# Patient Record
Sex: Female | Born: 1940 | Race: White | Hispanic: No | Smoking: Current every day smoker
Health system: Southern US, Community
[De-identification: ages and names within clinical notes are randomized; demographics above are authoritative.]

## PROBLEM LIST (undated history)

## (undated) DIAGNOSIS — R519 Headache, unspecified: Secondary | ICD-10-CM

## (undated) DIAGNOSIS — R51 Headache: Secondary | ICD-10-CM

## (undated) DIAGNOSIS — J449 Chronic obstructive pulmonary disease, unspecified: Secondary | ICD-10-CM

## (undated) DIAGNOSIS — I639 Cerebral infarction, unspecified: Secondary | ICD-10-CM

## (undated) DIAGNOSIS — C801 Malignant (primary) neoplasm, unspecified: Secondary | ICD-10-CM

## (undated) DIAGNOSIS — J189 Pneumonia, unspecified organism: Secondary | ICD-10-CM

## (undated) HISTORY — PX: NO PAST SURGERIES: SHX2092

---

## 2010-03-20 ENCOUNTER — Ambulatory Visit: Payer: Self-pay | Admitting: Cardiovascular Disease

## 2010-03-20 ENCOUNTER — Ambulatory Visit: Payer: Self-pay | Admitting: Pulmonary Disease

## 2010-03-20 ENCOUNTER — Inpatient Hospital Stay (HOSPITAL_COMMUNITY): Admission: EM | Admit: 2010-03-20 | Discharge: 2010-03-29 | Payer: Self-pay | Admitting: Emergency Medicine

## 2010-03-21 ENCOUNTER — Encounter: Payer: Self-pay | Admitting: Pulmonary Disease

## 2010-03-24 ENCOUNTER — Encounter (INDEPENDENT_AMBULATORY_CARE_PROVIDER_SITE_OTHER): Payer: Self-pay | Admitting: Internal Medicine

## 2010-04-05 ENCOUNTER — Ambulatory Visit: Payer: Self-pay | Admitting: Internal Medicine

## 2010-04-05 DIAGNOSIS — J4489 Other specified chronic obstructive pulmonary disease: Secondary | ICD-10-CM | POA: Insufficient documentation

## 2010-04-05 DIAGNOSIS — J45909 Unspecified asthma, uncomplicated: Secondary | ICD-10-CM | POA: Insufficient documentation

## 2010-04-05 DIAGNOSIS — J9620 Acute and chronic respiratory failure, unspecified whether with hypoxia or hypercapnia: Secondary | ICD-10-CM | POA: Insufficient documentation

## 2010-04-05 DIAGNOSIS — C4490 Unspecified malignant neoplasm of skin, unspecified: Secondary | ICD-10-CM

## 2010-04-05 DIAGNOSIS — B370 Candidal stomatitis: Secondary | ICD-10-CM | POA: Insufficient documentation

## 2010-04-05 DIAGNOSIS — J479 Bronchiectasis, uncomplicated: Secondary | ICD-10-CM

## 2010-04-05 DIAGNOSIS — J449 Chronic obstructive pulmonary disease, unspecified: Secondary | ICD-10-CM

## 2010-04-05 DIAGNOSIS — R0902 Hypoxemia: Secondary | ICD-10-CM

## 2010-04-08 ENCOUNTER — Ambulatory Visit: Payer: Self-pay | Admitting: Pulmonary Disease

## 2010-04-09 ENCOUNTER — Encounter: Payer: Self-pay | Admitting: Pulmonary Disease

## 2010-04-13 ENCOUNTER — Encounter: Payer: Self-pay | Admitting: Internal Medicine

## 2010-04-14 ENCOUNTER — Ambulatory Visit: Payer: Self-pay | Admitting: Pulmonary Disease

## 2010-04-14 DIAGNOSIS — F17200 Nicotine dependence, unspecified, uncomplicated: Secondary | ICD-10-CM

## 2010-06-28 ENCOUNTER — Ambulatory Visit: Payer: Self-pay | Admitting: Pulmonary Disease

## 2010-07-01 ENCOUNTER — Encounter: Payer: Self-pay | Admitting: Adult Health

## 2010-09-21 NOTE — Miscellaneous (Signed)
Summary: Orders Update pft charges  Clinical Lists Changes  Orders: Added new Service order of Carbon Monoxide diffusing w/capacity (94720) - Signed Added new Service order of Lung Volumes (94240) - Signed Added new Service order of Spirometry (Pre & Post) (94060) - Signed 

## 2010-09-21 NOTE — Assessment & Plan Note (Signed)
Summary: NP follow up - COPD   Primary Provider/Referring Provider:  n/a  CC:  2 month follow up COPD - states breathing is doing well.  no new complaints.  would like o2 dc'd but ONO was never done.  would like flu shot today.[name] [name]  History of Present Illness: 70/F, smoker with COPD/ bronchectasis & severe hypoxia during exacerbation. Admitted 03/20/2010-- 03/29/2010 for  Acute-on-chronic respiratory failure in the setting of acute  exacerbation of chronic obstructive pulmonary disease complicated  by underlying bronchiectasis and mucus plugging. and Thrush.  She was admitted w/ 3 weeks of progressively worsening dsypnea and cough despite OP tx. Found to be hypoxic on arrival to ER.    She was treated empirically with 7 days of oral antibiotics, aggressive systemic steroid taper,    aggressive bronchodilator regimen, and pulmonary hygiene measures. Discharged on slow steroid taper.  Continued on O2. at   oxygen at 5 L and instructed to increase to 6 L with activity.  alpha-1 antitrypsin level was 133  .  CT angio neg for PE, positive for mucus plugging and   bilateral bronchiectasis. Echo - bubble study neg for PFO.  April 14, 2010 3:56 PM   smoked 2 cigs since dc , off nicotine patch.  Doing well with Spriiva/Symbicorrt. She is feeling much better w/ decreased dypsnea.  PFTs >> surprisingly maintained FEV1 82%, small airways severely decreased at 24%, mild increased lung volumes, DLCO decreased at 65% - corrects for Va.   June 28, 2010--Presents for a 2 month follow up COPD .She says she is doing very well. Her breathing is doing well.  She is not wearing her O2 and would like to have it removed . We discussed setting up ONO to make sure she does not have nocturnal desats. Her activity tolerance is improving and no desats with walking today in office. Denies chest pain,  orthopnea, hemoptysis, fever, n/v/d, edema, headache  Medications Prior to Update: 1)  Symbicort 160-4.5 Mcg/act Aero  (Budesonide-Formoterol Fumarate) .... Inhale 1 Puffs Two Times A Day 2)  Pantoprazole Sodium 40 Mg Tbec (Pantoprazole Sodium) .... Take 1 Tablet By Mouth Once A Day 30 Minutes Before Breakfast 3)  Spiriva Handihaler 18 Mcg Caps (Tiotropium Bromide Monohydrate) .... Inhale Contents of 1 Capsule Once A Day 4)  Mucinex 600 Mg Xr12h-Tab (Guaifenesin) .... Take 1-2 Tablets Every 12 Hours As Needed 5)  Proair Hfa 108 (90 Base) Mcg/act Aers (Albuterol Sulfate) .... Inhale 2 Puffs Every Four Hours As Needed 6)  Singulair 10 Mg Tabs (Montelukast Sodium) .... Take 1 Tablet By Mouth Once A Day 7)  Oxygen .... Wear 5l/min At Rest and Increase To 6l/min With Activity 8)  Nicoderm Cq 14 Mg/24hr Pt24 (Nicotine) .... Change Patch Every 24 Hours As Needed 9)  Zyrtec Allergy 10 Mg Tabs (Cetirizine Hcl) .... Take 1 Tablet By Mouth Once A Day 10)  Nystatin 100000 Unit/ml Susp (Nystatin) .... Swish and [place] As Directed 11)  Vitamin C 500 Mg Tabs (Ascorbic Acid) .... Take 2 Tablet By Mouth Once A Day 12)  B Complex  Tabs (B Complex Vitamins) .... Take 1 Tablet By Mouth Once A Day 13)  Vitamin D3 1000 Unit Tabs (Cholecalciferol) .... Take 1 Tablet By Mouth Once A Day  Allergies (verified): 1)  ! Wellbutrin 2)  Codeine  Past History:  Past Medical History: Last updated: 04/05/2010  BRONCHIECTASIS (ICD-494.0) ACUTE AND CHRONIC RESPIRATORY FAILURE /COPD >>alpha-1 antitrypsin level was 133 03/29/10  03/2010  BNP <  30. March 23, 2010, pH of 7.48, pCO2 of 35, pO2 of 55, with a bicarbonate level of 26.  CT angio on July 31, 2011negative pulmonary embolism.  She had mucus plugging within the lower lobe,  evidence of bilateral bronchiectasis. 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  Risk Factors: Smoking Status: quit (04/14/2010) Packs/Day: 0.75 (04/14/2010)  Review of Systems      See HPI  Vital Signs:  Patient profile:   70 year old female Height:      57 inches Weight:      137.38 pounds BMI:     29.84 O2 Sat:      97 % on Room air Temp:     98.0 degrees F oral Pulse rate:   111 / minute BP sitting:   110 / 58  (left arm) Cuff size:   regular  Vitals Entered By: Boone Master CNA/MA (June 28, 2010 3:03 PM)  O2 Flow:  Room air  Serial Vital Signs/Assessments:  Comments: Ambulatory Pulse Oximetry  Resting; HR_98____    02 Sat_97%RA____  Lap1 (185 feet)   HR_109____   02 Sat_96%RA____ Lap2 (185 feet)   HR_112____   02 Sat_96%RA____    Lap3 (185 feet)   HR_113____   02 Sat_96%RA____  _X__Test Completed without Difficulty ___Test Stopped due to:  Renold Genta RCP, LPN  June 28, 2010 3:45 PM  By: Renold Genta RCP, LPN   CC: 2 month follow up COPD - states breathing is doing well.  no new complaints.  would like o2 dc'd but ONO was never done.  would like flu shot today. Is Patient Diabetic? No Comments Medications reviewed with patient Daytime contact number verified with patient. Boone Master CNA/MA  June 28, 2010 3:04 PM    Physical Exam  Additional Exam:  GEN: A/Ox3; pleasant , NAD HEENT:  [place]/AT, , EACs-clear, TMs-wnl, NOSE-clear, THROAT-clear NECK:  Supple w/ fair ROM; no JVD; normal carotid impulses w/o bruits; no thyromegaly or nodules palpated; no lymphadenopathy. RESP  Coarse BS w/ no wheeizng  CARD:  RRR, no m/r/g   GI:   Soft & nt; nml  bowel sounds; no organomegaly or masses detected. Musco: Warm bil,  no calf tenderness edema, clubbing, pulses intact Neuro: intact w/ no focal deficits noted.    Impression & Recommendations:  Problem # 1:  HYPOXEMIA (ICD-799.02) no exertional desaturations will set up ono and if ok, d/c O2.  Orders: Est. Patient Level III (16109)  Problem # 2:  C O P D (ICD-496) cont on same meds well compensated  flu shot today.   Complete Medication List: 1)  Symbicort 160-4.5 Mcg/act Aero (Budesonide-formoterol fumarate) .... Inhale 1 puffs two times a day 2)  Pantoprazole Sodium 40 Mg Tbec (Pantoprazole sodium) .... Take 1 tablet by mouth once a day 30 minutes before breakfast 3)  Spiriva Handihaler 18 Mcg Caps (Tiotropium bromide monohydrate) .... Inhale contents of 1 capsule once a  day 4)  Mucinex 600 Mg Xr12h-tab (Guaifenesin) .... Take 1-2 tablets every 12 hours as needed 5)  Proair Hfa 108 (90 Base) Mcg/act Aers (Albuterol sulfate) .... Inhale 2 puffs every four hours as needed 6)  Oxygen  .... Wear 5l/min at rest and increase to 6l/min with activity 7)  Nicoderm Cq 14 Mg/24hr Pt24 (Nicotine) .... Change patch every 24 hours as needed 8)  Zyrtec Allergy 10 Mg Tabs (Cetirizine hcl) .... Take 1 tablet by mouth once a day 9)  Vitamin C 500 Mg Tabs (Ascorbic acid) .... Take 2 tablet by mouth once a day 10)  B Complex Tabs (B complex vitamins) .... Take 1 tablet by mouth once a day 11)  Vitamin D3 1000 Unit Tabs (Cholecalciferol) .... Take 1 tablet by mouth once a day  Other Orders: DME Referral (DME) Flu Vaccine 53yrs + (95621) Admin 1st Vaccine (30865)  Patient Instructions: 1)  Continue on same meds.  2)  We are setting you up for an overnight Oxygen test.  3)  if this is okay, we will stop your Oxygen 4)  flu shot today.  5)  follow up Dr. Vassie Loll in 4 months and as needed  6)  Please contact office for sooner follow up if symptoms do not improve or worsen    Immunizations  Administered:  Influenza Vaccine # 1:    Vaccine Type: Fluvax 3+    Site: left deltoid    Mfr: novartis     Dose: 0.5 ml    Route: IM    Given by: Boone Master CNA/MA    Exp. Date: 01/21/2011    Lot #: [id number]    VIS given: 03/16/10 version given June 28, 2010.  Flu Vaccine Consent Questions:    Do you have a history of severe allergic reactions to this vaccine? no    Any prior history of allergic reactions to egg and/or gelatin? no    Do you have a sensitivity to the preservative Thimersol? no    Do you have a past history of Guillan-Barre Syndrome? no    Do you currently have an acute febrile illness? no    Have you ever had a severe reaction to latex? no    Vaccine information given and explained to patient? yes    Are you currently pregnant? no

## 2010-09-21 NOTE — Assessment & Plan Note (Signed)
Summary: NP follow up - post hosp   CC:  post hosp follow up - states breathing is doing well.  no new complaints.  History of Present Illness: 70 yo female former smoker( quit 03/20/10)  admitted w/ Acute-on-chronic respiratory failure in the setting of acute     exacerbation of chronic obstructive pulmonary disease complicated  by underlying bronchiectasis and mucus plugging. and Thrush.03/20/10 w/ initial pulmonary consult during hospitalization.   April 05, 2010-Presents for intiial OP ov for post hospital visit. Admitted 03/20/2010-- 03/29/2010 for  Acute-on-chronic respiratory failure in the setting of acute  exacerbation of chronic obstructive pulmonary disease complicated  by underlying bronchiectasis and mucus plugging. and Thrush.  She was admitted w/ 3 weeks of progressively worsening dsypnea and cough despite OP tx. Found to be hypoxic on arrival to ER.    She was treated empirically with 7 days of oral antibiotics, aggressive systemic steroid taper,    aggressive bronchodilator regimen, and pulmonary hygiene measures. Discharged on slow steroid taper.  Continued on O2. at   oxygen at 5 L and instructed to increase to 6 L with activity.  alpha-1 antitrypsin level was 133  .  CT angio neg for PE, positive for mucus plugging and   bilateral bronchiectasis. She has 2 days left of steroids. Doing well with Spriiva/Symbicorrt. She has not smoked since discharge, wearing nicotine patches. She is feeling much better w/ decreased dypsnea. Denies chest pain,  orthopnea, hemoptysis, fever, n/v/d, edema, headache.   Preventive Screening-Counseling & Management  Alcohol-Tobacco     Smoking Status: quit     Smoke Cessation Stage: quit     Year Started: 1962     Year Quit: 03-20-2010  Medications Prior to Update: 1)  None  Current Medications (verified): 1)  Symbicort 160-4.5 Mcg/act Aero (Budesonide-Formoterol Fumarate) .... Inhale 2 Puffs Two Times A Day 2)  Pantoprazole Sodium 40 Mg Tbec  (Pantoprazole Sodium) .... Take 1 Tablet By Mouth Once A Day 30 Minutes Before Breakfast 3)  Spiriva Handihaler 18 Mcg Caps (Tiotropium Bromide Monohydrate) .... Inhale Contents of 1 Capsule Once A Day 4)  Mucinex 600 Mg Xr12h-Tab (Guaifenesin) .... Take 1-2 Tablets Every 12 Hours As Needed 5)  Proair Hfa 108 (90 Base) Mcg/act Aers (Albuterol Sulfate) .... Inhale 2 Puffs Every Four Hours As Needed 6)  Singulair 10 Mg Tabs (Montelukast Sodium) .... Take 1 Tablet By Mouth Once A Day 7)  Oxygen .... Wear 5l/min At Rest and Increase To 6l/min With Activity 8)  Nicoderm Cq 14 Mg/24hr Pt24 (Nicotine) .... Change Patch Every 24 Hours  Allergies (verified): 1)  ! 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 March 23, 2010, pH of 7.48, pCO2 of 35, pO2 of 55, with a bicarbonate level of 26.  CT angio on July 31, 2011negative pulmonary embolism.  She had mucus plugging  within the lower lobe,  evidence of bilateral bronchiectasis. CANDIDIASIS OF MOUTH (ICD-112.0) HYPOXEMIA (ICD-799.02) CARCINOMA, BASAL CELL (ICD-173.9) C O P D (ICD-496) ASTHMA (ICD-493.90)    Family History: allergies - mother asthma - mother rheumatism - MGM w/ osteoarthritis cancer - mother (melanoma) DM - mother type II  controlled with diet, daughter is type I stroke - father alzheimers - father  mother still living at age 53  Social History: patient is divorced lives with daughter and granddaughter, 1 grown son has not smoked since 03-20-10 -  x40years, 1ppd denies illicit drug use denies alcohol manages apartment complexSmoking Status:  quit  Review of Systems      See HPI  Vital Signs:  Patient profile:   70 year old female Height:      57 inches Weight:      132 pounds BMI:     28.67 O2 Sat:      98 % on 5 L/min cont Temp:     99.9 degrees F oral Pulse rate:   61 / minute BP sitting:   132 / 68  (left arm) Cuff size:   regular  Vitals Entered By: Boone Master CNA/MA (April 05, 2010 10:40 AM)  O2 Flow:  5 L/min cont CC: post hosp follow up - states breathing is doing well.  no new complaints Is Patient Diabetic? No Comments Medications reviewed with patient Daytime contact number verified with patient. Boone Master CNA/MA  April 05, 2010 10:46 AM    Ambulatory Pulse Oximetry  Resting; HR__55__    02 Sat__94__  Lap1 (185 feet)   HR__76__   02 Sat__91__ Lap2 (185 feet)   HR__86__   02 Sat___93___    Lap3 (185 feet)   HR__78__   02 Sat___93___  __X__Test Completed without Difficulty ___Test Stopped due to:  Boone Master CNA/MA  April 05, 2010 11:24 AM    Physical Exam  Additional Exam:  GEN: A/Ox3; pleasant , NAD HEENT:  [place]/AT, , EACs-clear, TMs-wnl, NOSE-clear, THROAT-clear NECK:  Supple w/ fair ROM; no JVD; normal carotid impulses w/o bruits; no thyromegaly or nodules palpated; no lymphadenopathy. RESP  Coarse BS w/ no wheeizng  CARD:  RRR, no m/r/g   GI:   Soft & nt; nml bowel sounds; no organomegaly or masses detected. Musco: Warm bil,  no calf tenderness edema, clubbing, pulses intact Neuro: intact w/ no focal deficits noted.    Impression & Recommendations:  Problem # 1:  ACUTE AND CHRONIC RESPIRATORY FAILURE (ICD-518.84) w/ suspected COPD exacerbation  w/ CT chest showing bilateral bronchiectasis  will finish steroid taper.  cont on spiriva and symbicort no desaturations in office today on room air. will change O2 to 2 l/m at bedtime and 2l/m w/ activity  recheck at next ov may be able to d/c if cont w/ no desaturations.  set up for PFTs to eval. lung fxn  Orders: Est. Patient Level III (84132) Pulse Oximetry, Ambulatory (44010)  Medications Added to Medication List This Visit: 1)  Symbicort 160-4.5 Mcg/act Aero (Budesonide-formoterol fumarate) .... Inhale 2 puffs two times a day 2)  Pantoprazole Sodium 40 Mg Tbec (Pantoprazole sodium) .... Take 1 tablet by mouth once a day 30 minutes before breakfast 3)  Spiriva Handihaler 18 Mcg Caps (Tiotropium bromide monohydrate) .... Inhale contents of 1 capsule once a day 4)  Mucinex 600 Mg Xr12h-tab (Guaifenesin) .... Take 1-2 tablets every 12 hours as needed 5)  Proair Hfa 108 (90 Base) Mcg/act Aers (Albuterol sulfate) .... Inhale 2 puffs  every four hours as needed 6)  Singulair 10 Mg Tabs (Montelukast sodium) .... Take 1 tablet by mouth once a day 7)  Oxygen  .... Wear 5l/min at rest and increase to 6l/min with activity 8)  Nicoderm Cq 14 Mg/24hr Pt24 (Nicotine) .... Change patch every 24 hours  Complete Medication List: 1)  Symbicort 160-4.5 Mcg/act Aero (Budesonide-formoterol fumarate) .... Inhale 2 puffs two times a day 2)  Pantoprazole Sodium 40 Mg Tbec (Pantoprazole sodium) .... Take 1 tablet by mouth once a day 30 minutes before breakfast 3)  Spiriva Handihaler 18 Mcg Caps (Tiotropium bromide monohydrate) .... Inhale contents of 1 capsule once a day 4)  Mucinex 600 Mg Xr12h-tab (Guaifenesin) .... Take 1-2 tablets every 12 hours as needed 5)  Proair Hfa 108 (90 Base) Mcg/act Aers (Albuterol sulfate) .... Inhale 2 puffs every four hours as needed 6)  Singulair 10 Mg Tabs (Montelukast sodium) .... Take 1 tablet by mouth once a day 7)  Oxygen  .... Wear 5l/min at rest and increase to 6l/min  with activity 8)  Nicoderm Cq 14 Mg/24hr Pt24 (Nicotine) .... Change patch every 24 hours  Patient Instructions: 1)  Set up for PFT  2)  follow up Dr. Vassie Loll as scheduled.  3)  finish Steroid taper.  4)  Continue on Spiriva and Symbicort- brush/rinse/gargle after use.  5)  Continue on O2 at 2 liters with activity as needed and  6)  wear 2 l/m at bedtime  7)  YOU ARE DOING A GREAT JOB AT NO SMOKING-KEEP UP GOOD WORK.  8)  Please contact office for sooner follow up if symptoms do not improve or worsen    Immunization History:  Influenza Immunization History:    Influenza:  historical (05/22/2009)  Pneumovax Immunization History:    Pneumovax:  historical (05/22/2009)   Appended Document: NP follow up - post hosp would like to correct patient's walking oximetry.  this was done on room air.  Ambulatory Pulse Oximetry  Resting; HR__55__    02 Sat__94ra__  Lap1 (185 feet)   HR__76__   02 Sat__91ra__ Lap2 (185 feet)   HR__86__   02 Sat___93ra___    Lap3 (185 feet)   HR__78__   02 Sat___93ra___  __X__Test Completed without Difficulty ___Test Stopped due to:

## 2010-09-21 NOTE — Assessment & Plan Note (Signed)
Summary: [name] [name]   Visit Type:  Hospital Follow-up Primary Provider/Referring Provider:  n/a  CC:  Pt here for hospital follow up.  History of Present Illness: 70/F, smoker with COPD/ bronchectasis & severe hypoxia during exacerbation. Admitted 03/20/2010-- 03/29/2010 for  Acute-on-chronic respiratory failure in the setting of acute  exacerbation of chronic obstructive pulmonary disease complicated  by underlying bronchiectasis and mucus plugging. and Thrush.  She was admitted w/ 3 weeks of progressively worsening dsypnea and cough despite OP tx. Found to be hypoxic on arrival to ER.    She was treated empirically with 7 days of oral antibiotics, aggressive systemic steroid taper,    aggressive bronchodilator regimen, and pulmonary hygiene measures. Discharged on slow steroid taper.  Continued on O2. at   oxygen at 5 L and instructed to increase to 6 L with activity.  alpha-1 antitrypsin level was 133  .  CT angio neg for PE, positive for mucus plugging and   bilateral bronchiectasis. Echo - bubble study neg for PFO.  April 14, 2010 3:56 PM   smoked 2 cigs since dc , off nicotine patch.  Doing well with Spriiva/Symbicorrt. She is feeling much better w/ decreased dypsnea.  PFTs >> surprisingly maintained FEV1 82%, small airways severely decreased at 24%, mild increased lung volumes, DLCO decreased at 65% - sorrects for Va. Denies chest pain,  orthopnea, hemoptysis, fever, n/v/d, edema, headache.   Preventive Screening-Counseling & Management  Alcohol-Tobacco     Alcohol drinks/day: 0     Smoking Status: quit     Packs/Day: 0.75     Year Started: 1962     Year Quit: 03-20-2010  Current Medications (verified): 1)  Symbicort 160-4.5 Mcg/act Aero (Budesonide-Formoterol Fumarate) .... Inhale 1 Puffs Two Times A Day 2)  Pantoprazole Sodium 40 Mg Tbec (Pantoprazole Sodium) .... Take 1 Tablet By Mouth Once A Day 30 Minutes Before Breakfast 3)  Spiriva Handihaler 18 Mcg Caps (Tiotropium Bromide  Monohydrate) .... Inhale Contents of 1 Capsule Once A Day 4)  Mucinex 600 Mg Xr12h-Tab (Guaifenesin) .... Take 1-2 Tablets Every 12 Hours As Needed 5)  Proair Hfa 108 (90 Base) Mcg/act Aers (Albuterol Sulfate) .... Inhale 2 Puffs Every Four Hours As Needed 6)  Singulair 10 Mg Tabs (Montelukast Sodium) .... Take 1 Tablet By Mouth Once A Day 7)  Oxygen .... Wear 5l/min At Rest and Increase To 6l/min With Activity 8)  Nicoderm Cq 14 Mg/24hr Pt24 (Nicotine) .... Change Patch Every 24 Hours As Needed 9)  Zyrtec Allergy 10 Mg Tabs (Cetirizine Hcl) .... Take 1 Tablet By Mouth Once A Day 10)  Nystatin 100000 Unit/ml Susp (Nystatin) .... Swish and [place] As Directed 11)  Vitamin C 500 Mg Tabs (Ascorbic Acid) .... Take 2 Tablet By Mouth Once A Day 12)  B Complex  Tabs (B Complex Vitamins) .... Take 1 Tablet By Mouth Once A Day 13)  Vitamin D3 1000 Unit Tabs (Cholecalciferol) .... Take 1 Tablet By Mouth Once A Day  Allergies (verified): 1)  ! Wellbutrin 2)  Codeine  Past History:  Past Medical History: Last updated: 04/05/2010  BRONCHIECTASIS (ICD-494.0) ACUTE AND CHRONIC RESPIRATORY FAILURE /COPD >>alpha-1 antitrypsin level was 133 03/29/10  03/2010  BNP < 30. March 23, 2010, pH of 7.48, pCO2 of 35, pO2 of 55, with a bicarbonate level of 26.  CT angio on July 31, 2011negative pulmonary embolism.  She had mucus plugging within the lower lobe,  evidence of bilateral bronchiectasis. CANDIDIASIS OF MOUTH (ICD-112.0) HYPOXEMIA (ICD-799.02) CARCINOMA, BASAL  CELL (ICD-173.9) C O P D (ICD-496) ASTHMA (ICD-493.90)    Social History: Last updated: 04/05/2010 patient is divorced lives with daughter and granddaughter, 1 grown son has not smoked since 03-20-10 -  x40years, 1ppd denies illicit drug use denies alcohol manages apartment complex  Social History: Packs/Day:  0.75 Alcohol drinks/day:  0  Review of Systems       The patient complains of productive cough and weight change.  The patient  denies shortness of breath with activity, shortness of breath at rest, non-productive cough, coughing up blood, chest pain, irregular heartbeats, acid heartburn, indigestion, loss of appetite, abdominal pain, difficulty swallowing, sore throat, tooth/dental problems, headaches, nasal congestion/difficulty breathing through nose, sneezing, itching, ear ache, anxiety, depression, hand/feet swelling, joint stiffness or pain, rash, change in color of mucus, and fever.    Vital Signs:  Patient profile:   70 year old female Height:      57 inches Weight:      139 pounds BMI:     30.19 O2 Sat:      97 % on Room air Temp:     98.5 degrees F oral Pulse rate:   93 / minute BP sitting:   100 / 70  (left arm) Cuff size:   regular  Vitals Entered By: Zackery Barefoot CMA (April 14, 2010 3:43 PM)  O2 Flow:  Room air CC: Pt here for hospital follow up Comments Medications reviewed with patient Verified contact number and pharmacy with patient Zackery Barefoot CMA  April 14, 2010 3:43 PM    Physical Exam  Additional Exam:  GEN: A/Ox3; pleasant , NAD HEENT:  [place]/AT, , EACs-clear, TMs-wnl, NOSE-clear, THROAT-clear NECK:  Supple w/ fair ROM; no JVD; normal carotid impulses w/o bruits; no thyromegaly or nodules palpated; no lymphadenopathy. RESP  Coarse BS w/ no wheeizng  CARD:  RRR, no m/r/g   GI:   Soft & nt; nml bowel sounds; no organomegaly or masses detected. [place]: Warm bil,  no calf tenderness edema, clubbing, pulses intact Neuro: intact w/ no focal deficits noted.    Impression & Recommendations:  Problem # 1:  C O P D (ICD-496) Feel smaller airways more accurately reflect lung fucntion, FEV1 surprisingly preserved stay on symbicort/ spiriva Doubt asthma- can stop singulair  Problem # 2:  BRONCHIECTASIS (ICD-494.0) discussed plan for exacerbation  Problem # 3:  HYPOXEMIA (ICD-799.02)  chk ONO OK to stay off O2 at rest & walking  Orders: Est. Patient Level IV (16109)  Problem  # 4:  TOBACCO ABUSE (ICD-305.1)  Has relapsed - discussed complete cessation. Her updated medication list for this problem includes:    Nicoderm Cq 14 Mg/24hr Pt24 (Nicotine) .Marland Kitchen... Change patch every 24 hours as needed  Orders: Est. Patient Level IV (60454)  Medications Added to Medication List This Visit: 1)  Symbicort 160-4.5 Mcg/act Aero (Budesonide-formoterol fumarate) .... Inhale 1 puffs two times a day 2)  Nicoderm Cq 14 Mg/24hr Pt24 (Nicotine) .... Change patch every 24 hours as needed 3)  Zyrtec Allergy 10 Mg Tabs (Cetirizine hcl) .... Take 1 tablet by mouth once a day 4)  Nystatin 100000 Unit/ml Susp (Nystatin) .... Swish and [place] as directed 5)  Vitamin C 500 Mg Tabs (Ascorbic acid) .... Take 2 tablet by mouth once a day 6)  B Complex Tabs (B complex vitamins) .... Take 1 tablet by mouth once a day 7)  Vitamin D3 1000 Unit Tabs (Cholecalciferol) .... Take 1 tablet by mouth once a day  Other Orders:  DME Referral (DME)  Patient Instructions: 1)  Copy sent to: 2)  Please schedule a follow-up appointment in 2 months with TP 3)  Use nicotine patch 21 mg/day (higher dose) - you are at HIGH RISK for relapse. 4)  Call for chantix Rx if this does not work. 5)  STOP singulair 6)  Check oxygen level at night 7)  OK to return to work fro tomorrow

## 2010-09-21 NOTE — Procedures (Signed)
Summary: Oximetry/[facility]  Oximetry/[facility]   Imported By: Sherian Rein 07/13/2010 12:03:26  _____________________________________________________________________  External Attachment:    Type:   Image     Comment:   External Document

## 2010-09-21 NOTE — Letter (Signed)
Summary: Portable Oxygen/[facility]  Portable Oxygen/[facility]   Imported By: Sherian Rein 04/13/2010 09:51:17  _____________________________________________________________________  External Attachment:    Type:   Image     Comment:   External Document

## 2010-09-21 NOTE — Miscellaneous (Signed)
Summary: Care Plan/[facility]  Care Plan/[facility]   Imported By: Sherian Rein 04/16/2010 11:44:49  _____________________________________________________________________  External Attachment:    Type:   Image     Comment:   External Document

## 2010-09-21 NOTE — Consult Note (Signed)
Summary: [place]  [place]   Imported By: Lester Delight 04/13/2010 08:36:02  _____________________________________________________________________  External Attachment:    Type:   Image     Comment:   External Document

## 2010-11-05 LAB — BLOOD GAS, ARTERIAL
Drawn by: 277331
FIO2: 0.5 %
Patient temperature: 98.6
TCO2: 21.8 mmol/L (ref 0–100)
pH, Arterial: 7.481 — ABNORMAL HIGH (ref 7.350–7.400)
pO2, Arterial: 55.2 mmHg — ABNORMAL LOW (ref 80.0–100.0)

## 2010-11-05 LAB — GLUCOSE, CAPILLARY
Glucose-Capillary: 114 mg/dL — ABNORMAL HIGH (ref 70–99)
Glucose-Capillary: 140 mg/dL — ABNORMAL HIGH (ref 70–99)
Glucose-Capillary: 141 mg/dL — ABNORMAL HIGH (ref 70–99)
Glucose-Capillary: 143 mg/dL — ABNORMAL HIGH (ref 70–99)
Glucose-Capillary: 149 mg/dL — ABNORMAL HIGH (ref 70–99)
Glucose-Capillary: 158 mg/dL — ABNORMAL HIGH (ref 70–99)
Glucose-Capillary: 163 mg/dL — ABNORMAL HIGH (ref 70–99)

## 2010-11-05 LAB — BASIC METABOLIC PANEL
BUN: 20 mg/dL (ref 6–23)
Calcium: 8.8 mg/dL (ref 8.4–10.5)
Calcium: 8.8 mg/dL (ref 8.4–10.5)
Creatinine, Ser: 0.66 mg/dL (ref 0.4–1.2)
Creatinine, Ser: 0.67 mg/dL (ref 0.4–1.2)
Creatinine, Ser: 0.71 mg/dL (ref 0.4–1.2)
GFR calc Af Amer: >60 mL/min (ref >60)
GFR calc non Af Amer: >60 mL/min (ref >60)
GFR calc non Af Amer: >60 mL/min (ref >60)
GFR calc non Af Amer: >60 mL/min (ref >60)
Glucose, Bld: 100 mg/dL — ABNORMAL HIGH (ref 70–99)
Glucose, Bld: 133 mg/dL — ABNORMAL HIGH (ref 70–99)
Potassium: 3 mEq/L — ABNORMAL LOW (ref 3.5–5.1)
Potassium: 4 mEq/L (ref 3.5–5.1)
Sodium: 140 mEq/L (ref 135–145)

## 2010-11-06 LAB — COMPREHENSIVE METABOLIC PANEL
ALT: 18 U/L (ref 0–35)
AST: 18 U/L (ref 0–37)
Calcium: 9.3 mg/dL (ref 8.4–10.5)
GFR calc Af Amer: >60 mL/min (ref >60)
Sodium: 142 mEq/L (ref 135–145)
Total Protein: 6.9 g/dL (ref 6.0–8.3)

## 2010-11-06 LAB — CBC
HCT: 41.5 % (ref 36.0–46.0)
MCHC: 35.1 g/dL (ref 30.0–36.0)
MCV: 92.2 fL (ref 78.0–100.0)
RDW: 13.4 % (ref 11.5–15.5)

## 2010-11-06 LAB — CARDIAC PANEL(CRET KIN+CKTOT+MB+TROPI)
CK, MB: 2.1 ng/mL (ref 0.3–4.0)
CK, MB: 3.1 ng/mL (ref 0.3–4.0)
Relative Index: 2.3 (ref 0.0–2.5)
Relative Index: INVALID (ref 0.0–2.5)
Total CK: 124 U/L (ref 7–177)
Troponin I: 0.02 ng/mL (ref 0.00–0.06)
Troponin I: <0.01 ng/mL (ref 0.00–0.06)

## 2010-11-06 LAB — POCT CARDIAC MARKERS

## 2010-11-06 LAB — LIPID PANEL
LDL Cholesterol: 198 mg/dL — ABNORMAL HIGH (ref 0–99)
Total CHOL/HDL Ratio: 4 RATIO
Triglycerides: 90 mg/dL (ref <150)
VLDL: 18 mg/dL (ref 0–40)

## 2010-11-06 LAB — GLUCOSE, CAPILLARY
Glucose-Capillary: 147 mg/dL — ABNORMAL HIGH (ref 70–99)
Glucose-Capillary: 152 mg/dL — ABNORMAL HIGH (ref 70–99)

## 2010-11-06 LAB — BLOOD GAS, ARTERIAL
Acid-Base Excess: 0.6 mmol/L (ref 0.0–2.0)
Drawn by: 21338
O2 Saturation: 89.1 %
pO2, Arterial: 52.2 mmHg — ABNORMAL LOW (ref 80.0–100.0)

## 2010-11-06 LAB — POCT I-STAT, CHEM 8
BUN: 10 mg/dL (ref 6–23)
Calcium, Ion: 1.1 mmol/L — ABNORMAL LOW (ref 1.12–1.32)
Hemoglobin: 15 g/dL (ref 12.0–15.0)
TCO2: 26 mmol/L (ref 0–100)

## 2014-09-22 DIAGNOSIS — J189 Pneumonia, unspecified organism: Secondary | ICD-10-CM

## 2014-09-22 HISTORY — DX: Pneumonia, unspecified organism: J18.9

## 2014-10-21 DIAGNOSIS — C801 Malignant (primary) neoplasm, unspecified: Secondary | ICD-10-CM

## 2014-10-21 HISTORY — DX: Malignant (primary) neoplasm, unspecified: C80.1

## 2014-11-03 DIAGNOSIS — I639 Cerebral infarction, unspecified: Secondary | ICD-10-CM

## 2014-11-03 HISTORY — DX: Cerebral infarction, unspecified: I63.9

## 2014-11-04 ENCOUNTER — Encounter (HOSPITAL_COMMUNITY): Payer: Self-pay | Admitting: *Deleted

## 2014-11-04 ENCOUNTER — Inpatient Hospital Stay (HOSPITAL_COMMUNITY): Payer: Commercial Managed Care - PPO

## 2014-11-04 ENCOUNTER — Inpatient Hospital Stay (HOSPITAL_COMMUNITY)
Admission: AD | Admit: 2014-11-04 | Discharge: 2014-11-12 | DRG: 054 | Disposition: A | Discharge status: Hospice | Payer: Commercial Managed Care - PPO | Source: Other Acute Inpatient Hospital | Attending: Internal Medicine | Admitting: Internal Medicine

## 2014-11-04 DIAGNOSIS — J159 Unspecified bacterial pneumonia: Secondary | ICD-10-CM | POA: Diagnosis present

## 2014-11-04 DIAGNOSIS — Z515 Encounter for palliative care: Secondary | ICD-10-CM | POA: Diagnosis not present

## 2014-11-04 DIAGNOSIS — C801 Malignant (primary) neoplasm, unspecified: Secondary | ICD-10-CM

## 2014-11-04 DIAGNOSIS — Z791 Long term (current) use of non-steroidal anti-inflammatories (NSAID): Secondary | ICD-10-CM

## 2014-11-04 DIAGNOSIS — C3412 Malignant neoplasm of upper lobe, left bronchus or lung: Secondary | ICD-10-CM | POA: Diagnosis present

## 2014-11-04 DIAGNOSIS — Z720 Tobacco use: Secondary | ICD-10-CM | POA: Diagnosis not present

## 2014-11-04 DIAGNOSIS — C7931 Secondary malignant neoplasm of brain: Principal | ICD-10-CM | POA: Diagnosis present

## 2014-11-04 DIAGNOSIS — Z79899 Other long term (current) drug therapy: Secondary | ICD-10-CM

## 2014-11-04 DIAGNOSIS — E43 Unspecified severe protein-calorie malnutrition: Secondary | ICD-10-CM | POA: Diagnosis present

## 2014-11-04 DIAGNOSIS — Z66 Do not resuscitate: Secondary | ICD-10-CM

## 2014-11-04 DIAGNOSIS — F17200 Nicotine dependence, unspecified, uncomplicated: Secondary | ICD-10-CM | POA: Diagnosis not present

## 2014-11-04 DIAGNOSIS — Z8673 Personal history of transient ischemic attack (TIA), and cerebral infarction without residual deficits: Secondary | ICD-10-CM | POA: Diagnosis not present

## 2014-11-04 DIAGNOSIS — J918 Pleural effusion in other conditions classified elsewhere: Secondary | ICD-10-CM | POA: Diagnosis present

## 2014-11-04 DIAGNOSIS — Z885 Allergy status to narcotic agent status: Secondary | ICD-10-CM

## 2014-11-04 DIAGNOSIS — E876 Hypokalemia: Secondary | ICD-10-CM | POA: Diagnosis present

## 2014-11-04 DIAGNOSIS — C3490 Malignant neoplasm of unspecified part of unspecified bronchus or lung: Secondary | ICD-10-CM | POA: Diagnosis not present

## 2014-11-04 DIAGNOSIS — Z820 Family history of epilepsy and other diseases of the nervous system: Secondary | ICD-10-CM

## 2014-11-04 DIAGNOSIS — J449 Chronic obstructive pulmonary disease, unspecified: Secondary | ICD-10-CM | POA: Diagnosis present

## 2014-11-04 DIAGNOSIS — T380X5A Adverse effect of glucocorticoids and synthetic analogues, initial encounter: Secondary | ICD-10-CM | POA: Diagnosis present

## 2014-11-04 DIAGNOSIS — G4700 Insomnia, unspecified: Secondary | ICD-10-CM | POA: Diagnosis present

## 2014-11-04 DIAGNOSIS — G936 Cerebral edema: Secondary | ICD-10-CM | POA: Diagnosis present

## 2014-11-04 DIAGNOSIS — Z23 Encounter for immunization: Secondary | ICD-10-CM

## 2014-11-04 DIAGNOSIS — Z888 Allergy status to other drugs, medicaments and biological substances status: Secondary | ICD-10-CM

## 2014-11-04 DIAGNOSIS — F40240 Claustrophobia: Secondary | ICD-10-CM | POA: Diagnosis present

## 2014-11-04 DIAGNOSIS — J42 Unspecified chronic bronchitis: Secondary | ICD-10-CM | POA: Diagnosis not present

## 2014-11-04 DIAGNOSIS — F17210 Nicotine dependence, cigarettes, uncomplicated: Secondary | ICD-10-CM | POA: Diagnosis present

## 2014-11-04 DIAGNOSIS — D72829 Elevated white blood cell count, unspecified: Secondary | ICD-10-CM | POA: Diagnosis present

## 2014-11-04 DIAGNOSIS — R64 Cachexia: Secondary | ICD-10-CM | POA: Diagnosis present

## 2014-11-04 DIAGNOSIS — R630 Anorexia: Secondary | ICD-10-CM | POA: Diagnosis not present

## 2014-11-04 DIAGNOSIS — G9340 Encephalopathy, unspecified: Secondary | ICD-10-CM | POA: Diagnosis present

## 2014-11-04 DIAGNOSIS — Z681 Body mass index (BMI) 19 or less, adult: Secondary | ICD-10-CM

## 2014-11-04 DIAGNOSIS — D630 Anemia in neoplastic disease: Secondary | ICD-10-CM | POA: Diagnosis present

## 2014-11-04 DIAGNOSIS — Z823 Family history of stroke: Secondary | ICD-10-CM | POA: Diagnosis not present

## 2014-11-04 DIAGNOSIS — Z800 Family history of malignant neoplasm of digestive organs: Secondary | ICD-10-CM

## 2014-11-04 DIAGNOSIS — Z808 Family history of malignant neoplasm of other organs or systems: Secondary | ICD-10-CM | POA: Diagnosis not present

## 2014-11-04 DIAGNOSIS — R531 Weakness: Secondary | ICD-10-CM

## 2014-11-04 DIAGNOSIS — E039 Hypothyroidism, unspecified: Secondary | ICD-10-CM | POA: Diagnosis present

## 2014-11-04 DIAGNOSIS — R519 Headache, unspecified: Secondary | ICD-10-CM

## 2014-11-04 DIAGNOSIS — R51 Headache: Secondary | ICD-10-CM | POA: Diagnosis present

## 2014-11-04 DIAGNOSIS — J158 Pneumonia due to other specified bacteria: Secondary | ICD-10-CM | POA: Diagnosis present

## 2014-11-04 HISTORY — DX: Cerebral infarction, unspecified: I63.9

## 2014-11-04 HISTORY — DX: Pneumonia, unspecified organism: J18.9

## 2014-11-04 HISTORY — DX: Headache, unspecified: R51.9

## 2014-11-04 HISTORY — DX: Chronic obstructive pulmonary disease, unspecified: J44.9

## 2014-11-04 HISTORY — DX: Malignant (primary) neoplasm, unspecified: C80.1

## 2014-11-04 HISTORY — DX: Headache: R51

## 2014-11-04 LAB — COMPREHENSIVE METABOLIC PANEL
ALK PHOS: 80 U/L (ref 39–117)
ALT: 11 U/L (ref 0–35)
AST: 8 U/L (ref 0–37)
Albumin: 2.7 g/dL — ABNORMAL LOW (ref 3.5–5.2)
Anion gap: 9 (ref 5–15)
BUN: 15 mg/dL (ref 6–23)
CO2: 32 mmol/L (ref 19–32)
Calcium: 8.2 mg/dL — ABNORMAL LOW (ref 8.4–10.5)
Chloride: 94 mmol/L — ABNORMAL LOW (ref 96–112)
Creatinine, Ser: 0.62 mg/dL (ref 0.50–1.10)
GFR calc Af Amer: >90 mL/min (ref >90)
GFR calc non Af Amer: 87 mL/min — ABNORMAL LOW (ref >90)
Glucose, Bld: 207 mg/dL — ABNORMAL HIGH (ref 70–99)
Potassium: 3.6 mmol/L (ref 3.5–5.1)
SODIUM: 135 mmol/L (ref 135–145)
TOTAL PROTEIN: 6.1 g/dL (ref 6.0–8.3)
Total Bilirubin: 0.8 mg/dL (ref 0.3–1.2)

## 2014-11-04 LAB — CBC WITH DIFFERENTIAL/PLATELET
BASOS ABS: 0 10*3/uL (ref 0.0–0.1)
Basophils Relative: 0 % (ref 0–1)
EOS ABS: 0 10*3/uL (ref 0.0–0.7)
Eosinophils Relative: 0 % (ref 0–5)
HCT: 34.4 % — ABNORMAL LOW (ref 36.0–46.0)
Hemoglobin: 10.9 g/dL — ABNORMAL LOW (ref 12.0–15.0)
Lymphocytes Relative: 1 % — ABNORMAL LOW (ref 12–46)
Lymphs Abs: 0.3 10*3/uL — ABNORMAL LOW (ref 0.7–4.0)
MCH: 24.1 pg — AB (ref 26.0–34.0)
MCHC: 31.7 g/dL (ref 30.0–36.0)
MCV: 75.9 fL — ABNORMAL LOW (ref 78.0–100.0)
MONO ABS: 0.3 10*3/uL (ref 0.1–1.0)
Monocytes Relative: 1 % — ABNORMAL LOW (ref 3–12)
Neutro Abs: 26.9 10*3/uL — ABNORMAL HIGH (ref 1.7–7.7)
Neutrophils Relative %: 98 % — ABNORMAL HIGH (ref 43–77)
PLATELETS: 322 10*3/uL (ref 150–400)
RBC: 4.53 MIL/uL (ref 3.87–5.11)
RDW: 16.4 % — AB (ref 11.5–15.5)
WBC: 27.5 10*3/uL — ABNORMAL HIGH (ref 4.0–10.5)

## 2014-11-04 LAB — TSH: TSH: 8.984 u[IU]/mL — AB (ref 0.350–4.500)

## 2014-11-04 LAB — PROTIME-INR
INR: 1.09 (ref 0.00–1.49)
Prothrombin Time: 14.2 seconds (ref 11.6–15.2)

## 2014-11-04 LAB — PHOSPHORUS: PHOSPHORUS: 3 mg/dL (ref 2.3–4.6)

## 2014-11-04 LAB — MAGNESIUM: Magnesium: 2.1 mg/dL (ref 1.5–2.5)

## 2014-11-04 LAB — APTT: aPTT: 29 seconds (ref 24–37)

## 2014-11-04 MED ORDER — ENSURE COMPLETE PO LIQD
237.0000 mL | Freq: Two times a day (BID) | ORAL | Status: DC
  Administered 2014-11-05: 237 mL via ORAL

## 2014-11-04 MED ORDER — ZOLPIDEM TARTRATE 5 MG PO TABS: 5.0000 mg | ORAL_TABLET | Freq: Every evening | ORAL | Status: DC | PRN

## 2014-11-04 MED ORDER — IPRATROPIUM-ALBUTEROL 0.5-2.5 (3) MG/3ML IN SOLN: 3.0000 mL | RESPIRATORY_TRACT | Status: DC | PRN

## 2014-11-04 MED ORDER — ADULT MULTIVITAMIN W/MINERALS CH
1.0000 | ORAL_TABLET | Freq: Every day | ORAL | Status: DC
  Administered 2014-11-04 – 2014-11-09 (×6): 1 via ORAL
  Filled 2014-11-04 (×9): qty 1

## 2014-11-04 MED ORDER — GUAIFENESIN-DM 100-10 MG/5ML PO SYRP: 5.0000 mL | ORAL_SOLUTION | ORAL | Status: DC | PRN

## 2014-11-04 MED ORDER — ONDANSETRON HCL 4 MG/2ML IJ SOLN: 4.0000 mg | Freq: Four times a day (QID) | INTRAMUSCULAR | Status: DC | PRN

## 2014-11-04 MED ORDER — ACETAMINOPHEN 650 MG RE SUPP: 650.0000 mg | Freq: Four times a day (QID) | RECTAL | Status: DC | PRN

## 2014-11-04 MED ORDER — PANTOPRAZOLE SODIUM 40 MG PO TBEC
40.0000 mg | DELAYED_RELEASE_TABLET | Freq: Every day | ORAL | Status: DC
  Administered 2014-11-05 – 2014-11-11 (×6): 40 mg via ORAL
  Filled 2014-11-04 (×8): qty 1

## 2014-11-04 MED ORDER — POTASSIUM CHLORIDE 20 MEQ/15ML (10%) PO SOLN: 40.0000 meq | Freq: Once | ORAL | Status: DC

## 2014-11-04 MED ORDER — PIPERACILLIN-TAZOBACTAM 3.375 G IVPB
3.3750 g | Freq: Three times a day (TID) | INTRAVENOUS | Status: DC
Start: 2014-11-04 — End: 2014-11-07
  Administered 2014-11-04 – 2014-11-07 (×8): 3.375 g via INTRAVENOUS
  Filled 2014-11-04 (×9): qty 50

## 2014-11-04 MED ORDER — ACETAMINOPHEN 325 MG PO TABS
650.0000 mg | ORAL_TABLET | Freq: Four times a day (QID) | ORAL | Status: DC | PRN
  Administered 2014-11-05 – 2014-11-07 (×4): 650 mg via ORAL
  Filled 2014-11-04 (×4): qty 2

## 2014-11-04 MED ORDER — INFLUENZA VAC SPLIT QUAD 0.5 ML IM SUSY
0.5000 mL | PREFILLED_SYRINGE | INTRAMUSCULAR | Status: AC
  Administered 2014-11-05: 0.5 mL via INTRAMUSCULAR
  Filled 2014-11-04 (×3): qty 0.5

## 2014-11-04 MED ORDER — MORPHINE SULFATE 2 MG/ML IJ SOLN
2.0000 mg | INTRAMUSCULAR | Status: DC | PRN
  Administered 2014-11-08 – 2014-11-10 (×4): 2 mg via INTRAVENOUS
  Filled 2014-11-04 (×4): qty 1

## 2014-11-04 MED ORDER — HYDROCODONE-ACETAMINOPHEN 5-325 MG PO TABS
1.0000 | ORAL_TABLET | ORAL | Status: DC | PRN
  Administered 2014-11-04: 2 via ORAL
  Administered 2014-11-06: 1 via ORAL
  Administered 2014-11-07 – 2014-11-08 (×2): 2 via ORAL
  Filled 2014-11-04: qty 2
  Filled 2014-11-04: qty 1
  Filled 2014-11-04: qty 2

## 2014-11-04 MED ORDER — POLYETHYLENE GLYCOL 3350 17 G PO PACK: 17.0000 g | PACK | Freq: Every day | ORAL | Status: DC | PRN

## 2014-11-04 MED ORDER — ONDANSETRON HCL 4 MG PO TABS: 4.0000 mg | ORAL_TABLET | Freq: Four times a day (QID) | ORAL | Status: DC | PRN

## 2014-11-04 MED ORDER — SODIUM CHLORIDE 0.9 % IV SOLN
INTRAVENOUS | Status: AC
  Administered 2014-11-04 – 2014-11-06 (×3): via INTRAVENOUS

## 2014-11-04 MED ORDER — DEXAMETHASONE SODIUM PHOSPHATE 10 MG/ML IJ SOLN
10.0000 mg | Freq: Four times a day (QID) | INTRAMUSCULAR | Status: AC
Start: 2014-11-04 — End: 2014-11-05
  Administered 2014-11-04 – 2014-11-05 (×5): 10 mg via INTRAVENOUS
  Filled 2014-11-04 (×5): qty 1

## 2014-11-04 MED ORDER — LEVOTHYROXINE SODIUM 25 MCG PO TABS
25.0000 ug | ORAL_TABLET | Freq: Every day | ORAL | Status: DC
  Administered 2014-11-05 – 2014-11-12 (×8): 25 ug via ORAL
  Filled 2014-11-04 (×8): qty 1

## 2014-11-04 MED ORDER — ALUM & MAG HYDROXIDE-SIMETH 200-200-20 MG/5ML PO SUSP: 30.0000 mL | Freq: Four times a day (QID) | ORAL | Status: DC | PRN

## 2014-11-04 NOTE — H&P (Signed)
Patient Demographics  [name] [name], is a 74 y.o. female  MRN: [id number]   DOB - [birth year]  Admit Date - 11/04/2014  Outpatient Primary MD for the patient is No primary care provider on file.   Assessment Nalah Macioce is a pleasant 74 year old female with COPD and tobacco dependency just diagnosed of lung CA(lung biopsy done by [facility] on 10/31/14, and results confirming malignancy shared with patient and family today), who comes in as a referral from [facility] regional ED to be followed by Dr. Alvy Bimler here, after she was found on the floor by family members this morning. Apparently, she could not get up on her on and this is likely a result of metastatic brain lesions(patient has disc with radiology imaging from [place] [facility] but these haven't been uploaded to our system as yet therefore I cant comment on the specifics of the reported findings). However, patient's chest x-ray here shows "Stable chest radiograph findings.  Stable volume loss in the left hemithorax. Again noted is a large lesion involving the left upper lobe and suspect left pleural fluid". Labs so far show a white count of 27,500, potassium 3.6, TSH 8.98. Of note is that patient received high-dose dexamethasone at [facility]. There is question whether she may have postobstructive pneumonia. She will be admitted for further management including lung CA with brain metastases management which will be deferred to Dr.Gorsuch, antibiotics for likely postobstructive pneumonia(Zosyn for anerobe coverage-patient treated with antibiotics in the last 2 weeks for suspected community-acquired pneumonia), Synthroid for hypothyroidism, high-dose steroids pending oncology direction regarding possible palliative brain radiation and physical therapy consultation for ambulation. Patient is oriented in person but doesn't seem to be able to make complex medical decisions. Her family including her sister and daughter plan to go  over the current status with patient in the morning to figure out goals of care and power of attorney. Patient is full code at present. Dr. Alvy Bimler will see patient in a.m(spoke with Dr Benay Spice). Plan Lung cancer/Brain metastases  High-dose steroids  Defer rest of management to oncology COPD (chronic obstructive pulmonary disease)/Pneumonia due to anaerobes/Leucocytosis/Tobacco dependency  Follow UA  Bronchodilators/oxygen supplementation as necessary/Zosyn/nicotine patch Hypothyroidism  Check free T4/T3  Synthroid low-dose DVT/GI Prophylaxis  SCDs  PPI Family Communication: Admission, patients condition and plan of care including tests being ordered have been discussed with the patient and family who indicate understanding and agree with the plan and Code Status.  Code Status   Full  Likely DC  TBD  Condition GUARDED    Time spent in minutes : 67  [place] on the floor by family this morning. Generalized weakness.Marland Kitchen    HPI [name] [name]  is a 74 y.o. female which is a lifelong smoker with COPD and comes after her grand daughter found her on the floor around 6:30 AM today. She could not get up on her on. Patient does not recall how and at what time she ended up on the floor. Apparently she normally gets up around 6:30 in the morning. She had been feeling weak and received antibiotics in the last 2 weeks for suspected pneumonia. She was found to have a left lung mass which was biopsied by pulmonary on Friday the last week and apparently suggested malignancy. The results of the biopsy were shared with the patient's family at [facility] ED today at which point the family decided to bring patient to [place] to be followed by  Dr. Alvy Bimler who takes care of patients daughter. Patient gives limited history as she is lethargic therefore most of the history is obtained from family members including her sisters, mother, her daughter and grandchildren. She was  complaining of headaches before. She denies fever.   Review of Systems   As in the HPI above.   Past Medical History  Diagnosis Date  . COPD (chronic obstructive pulmonary disease)   . Stroke 11/03/14    per family  . Pneumonia Feb 2016  . Headache   . Cancer Mar. 2016    primary lung with mets to brain      Past Surgical History  Procedure Laterality Date  . No past surgeries      Social History History  Substance Use Topics  . Smoking status: Current Every Day Smoker -- 1.00 packs/day for 50 years  . Smokeless tobacco: Never Used  . Alcohol Use: No    Family History History reviewed. No pertinent family history.  Prior to Admission medications   Medication Sig Start Date End Date Taking? Authorizing Provider  Cholecalciferol (VITAMIN D PO) Take 1 tablet by mouth daily.   Yes Historical Provider, MD  Cyanocobalamin (VITAMIN B 12 PO) Take 1 tablet by mouth daily.   Yes Historical Provider, MD  ibuprofen (ADVIL,MOTRIN) 200 MG tablet Take 400 mg by mouth every 6 (six) hours as needed for headache.   Yes Historical Provider, MD  zolpidem (AMBIEN) 10 MG tablet Take 10 mg by mouth at bedtime as needed for sleep.   Yes Historical Provider, MD    Allergies  Allergen Reactions  . Bupropion Hcl     REACTION: hives  . Codeine     REACTION: nausea but can tolerate with food    Physical Exam  Vitals  Blood pressure 123/50, pulse 86, temperature 97.4 F (36.3 C), temperature source Oral, resp. rate 19, height 5\' 6"  (1.676 m), weight 55.339 kg (122 lb), SpO2 96 %.   1. General: lying in bed in NAD. Lethargic.  2. Normal affect and insight, Not Suicidal or Homicidal, Awake Alert, Oriented X 3.  3. No F.N deficits, ALL C.Nerves Intact, Strength 5/5 all 4 extremities, Sensation intact all 4 extremities, Plantars down going.  4. Ears and Eyes appear Normal, Conjunctivae clear, PERRLA. Moist Oral Mucosa.  5. Supple Neck, No JVD, No cervical lymphadenopathy appriciated, No  Carotid Bruits.  6. Symmetrical Chest wall movement, Good air movement bilaterally, CTAB.  7. RRR, No Gallops, Rubs or Murmurs, No Parasternal Heave.  8. Positive Bowel Sounds, Abdomen Soft, Non tender, No organomegaly appriciated,No rebound -guarding or rigidity.  9.  No Cyanosis, Normal Skin Turgor, No Skin Rash or Bruise.  10. Good muscle tone,  joints appear normal , no effusions, Normal ROM.  11. No Palpable Lymph Nodes in Neck or Axillae  Data Review CBC  Recent Labs Lab 11/04/14 2000  WBC 27.5*  HGB 10.9*  HCT 34.4*  PLT 322  MCV 75.9*  MCH 24.1*  MCHC 31.7  RDW 16.4*  LYMPHSABS PENDING  MONOABS PENDING  EOSABS PENDING  BASOSABS PENDING   ------------------------------------------------------------------------------------------------------------------  Chemistries   Recent Labs Lab 11/04/14 2000  NA 135  K 3.6  CL 94*  CO2 32  GLUCOSE 207*  BUN 15  CREATININE 0.62  CALCIUM 8.2*  MG 2.1  AST 8  ALT 11  ALKPHOS 80  BILITOT 0.8   ------------------------------------------------------------------------------------------------------------------ estimated creatinine clearance is 54.7 mL/min (by C-G formula based on Cr of 0.62). ------------------------------------------------------------------------------------------------------------------  Recent Labs  11/04/14 2000  TSH 8.984*     Coagulation profile  Recent Labs Lab 11/04/14 2000  INR 1.09   ------------------------------------------------------------------------------------------------------------------- No results for input(s): DDIMER in the last 72 hours. -------------------------------------------------------------------------------------------------------------------  Cardiac Enzymes No results for input(s): CKMB, TROPONINI, MYOGLOBIN in the last 168 hours.  Invalid input(s):  CK ------------------------------------------------------------------------------------------------------------------ Invalid input(s): POCBNP   ---------------------------------------------------------------------------------------------------------------  Urinalysis No results found for: COLORURINE, APPEARANCEUR, LABSPEC, [place], GLUCOSEU, HGBUR, BILIRUBINUR, KETONESUR, PROTEINUR, UROBILINOGEN, NITRITE, LEUKOCYTESUR  ----------------------------------------------------------------------------------------------------------------  Imaging results  Portable Chest 1 View  11/04/2014   CLINICAL DATA:  COPD and lung cancer.  Headache.  EXAM: PORTABLE CHEST - 1 VIEW  COMPARISON:  11/04/2014  FINDINGS: Again noted is a large density in the left upper lung compatible with a mass. Persistent densities at the left lung base suggest pleural fluid and volume loss. Coarse lung markings compatible with chronic changes and emphysema. Heart and mediastinum appear to be stable. Negative for a pneumothorax.  IMPRESSION: Stable chest radiograph findings.  Stable volume loss in the left hemithorax. Again noted is a large lesion involving the left upper lobe and suspect left pleural fluid.   Electronically Signed   By: Markus Daft M.D.   On: 11/04/2014 21:03        Cylas Falzone M.D on 11/04/2014 at 11:08 PM  Between 7am to 7pm - Pager - [phone]  After 7pm go to [email or web address] - password [email or web address]  And look for the night coverage person covering me after hours  [facility] Office  [phone]

## 2014-11-05 ENCOUNTER — Ambulatory Visit
Admit: 2014-11-05 | Discharge: 2014-11-05 | Disposition: A | Discharge status: Home / Self Care | Payer: Commercial Managed Care - PPO | Attending: Radiation Oncology | Admitting: Radiation Oncology

## 2014-11-05 ENCOUNTER — Inpatient Hospital Stay (HOSPITAL_COMMUNITY): Payer: Commercial Managed Care - PPO

## 2014-11-05 DIAGNOSIS — E039 Hypothyroidism, unspecified: Secondary | ICD-10-CM

## 2014-11-05 DIAGNOSIS — J42 Unspecified chronic bronchitis: Secondary | ICD-10-CM

## 2014-11-05 DIAGNOSIS — J158 Pneumonia due to other specified bacteria: Secondary | ICD-10-CM

## 2014-11-05 DIAGNOSIS — Z720 Tobacco use: Secondary | ICD-10-CM

## 2014-11-05 DIAGNOSIS — J188 Other pneumonia, unspecified organism: Secondary | ICD-10-CM

## 2014-11-05 DIAGNOSIS — E43 Unspecified severe protein-calorie malnutrition: Secondary | ICD-10-CM

## 2014-11-05 DIAGNOSIS — D630 Anemia in neoplastic disease: Secondary | ICD-10-CM

## 2014-11-05 LAB — URINALYSIS, ROUTINE W REFLEX MICROSCOPIC
GLUCOSE, UA: NEGATIVE mg/dL
HGB URINE DIPSTICK: NEGATIVE
Ketones, ur: NEGATIVE mg/dL
Leukocytes, UA: NEGATIVE
Nitrite: NEGATIVE
PH: 6 (ref 5.0–8.0)
Protein, ur: 30 mg/dL — AB
SPECIFIC GRAVITY, URINE: 1.028 (ref 1.005–1.030)
Urobilinogen, UA: 1 mg/dL (ref 0.0–1.0)

## 2014-11-05 LAB — URINE MICROSCOPIC-ADD ON

## 2014-11-05 LAB — CBC
HCT: 32.7 % — ABNORMAL LOW (ref 36.0–46.0)
Hemoglobin: 10.4 g/dL — ABNORMAL LOW (ref 12.0–15.0)
MCH: 24.2 pg — ABNORMAL LOW (ref 26.0–34.0)
MCHC: 31.8 g/dL (ref 30.0–36.0)
MCV: 76.2 fL — ABNORMAL LOW (ref 78.0–100.0)
Platelets: 327 10*3/uL (ref 150–400)
RBC: 4.29 MIL/uL (ref 3.87–5.11)
RDW: 16.6 % — ABNORMAL HIGH (ref 11.5–15.5)
WBC: 25.6 10*3/uL — ABNORMAL HIGH (ref 4.0–10.5)

## 2014-11-05 LAB — BASIC METABOLIC PANEL
Anion gap: 9 (ref 5–15)
BUN: 17 mg/dL (ref 6–23)
CHLORIDE: 98 mmol/L (ref 96–112)
CO2: 30 mmol/L (ref 19–32)
CREATININE: 0.51 mg/dL (ref 0.50–1.10)
Calcium: 8.4 mg/dL (ref 8.4–10.5)
GFR calc Af Amer: >90 mL/min (ref >90)
GLUCOSE: 163 mg/dL — AB (ref 70–99)
POTASSIUM: 3.2 mmol/L — AB (ref 3.5–5.1)
Sodium: 137 mmol/L (ref 135–145)

## 2014-11-05 LAB — T4, FREE: FREE T4: 0.85 ng/dL (ref 0.80–1.80)

## 2014-11-05 MED ORDER — LORAZEPAM 0.5 MG PO TABS
0.5000 mg | ORAL_TABLET | Freq: Four times a day (QID) | ORAL | Status: DC | PRN
  Administered 2014-11-05: 0.5 mg via ORAL
  Filled 2014-11-05: qty 1

## 2014-11-05 MED ORDER — BOOST / RESOURCE BREEZE PO LIQD
1.0000 | Freq: Two times a day (BID) | ORAL | Status: DC
  Administered 2014-11-05 – 2014-11-10 (×9): 1 via ORAL

## 2014-11-05 MED ORDER — TEMAZEPAM 7.5 MG PO CAPS
7.5000 mg | ORAL_CAPSULE | Freq: Every day | ORAL | Status: DC
  Administered 2014-11-05 – 2014-11-10 (×5): 7.5 mg via ORAL
  Filled 2014-11-05 (×6): qty 1

## 2014-11-05 NOTE — Progress Notes (Signed)
[facility] PROGRESS NOTE  [name] [name] ZOX:[id number] DOB: 1941-07-11 DOA: 11/04/2014 PCP: No primary care provider on file.  Assessment/Plan:    Pneumonia due to anaerobes - Patient is currently on Zosyn  Active Problems:   Lung cancer - Oncologist consulted    Brain metastases - Continue decadron - Radiologist/oncologist consulted    COPD (chronic obstructive pulmonary disease) - Stable continue bronchodilators as needed     Leucocytosis - Patient is currently on Decadron and there is a report she may have received some prior to admission    Hypothyroidism - Synthroid as patient had elevated TSH upon initial evaluation. - Will need repeat TSH levels in 4-6 weeks as outpatient    Protein-calorie malnutrition, severe - RD on board - Agree with liberalize his diet to regular diet  Code Status: full Family Communication: none at bedside Disposition Plan: Pending further recommendations from specialist involved   Consultants:  Oncology  Rad Onc  Antibiotics:  Zosyn  HPI/Subjective: Pt has no new complaints. No acute issues overnight.  Objective: Filed Vitals:   11/05/14 1345  BP: 123/52  Pulse: 74  Temp: 97.4 F (36.3 C)  Resp: 20    Intake/Output Summary (Last 24 hours) at 11/05/14 1512 Last data filed at 11/05/14 1330  Gross per 24 hour  Intake  877.5 ml  Output    620 ml  Net  257.5 ml   Filed Weights   11/04/14 1700  Weight: 55.339 kg (122 lb)    Exam:   General:  Pt in nad, alert nad awake  Cardiovascular: RRR, no mrg  Respiratory: Clear to auscultation bilaterally, no wheezes, equal chest rise  Abdomen: Soft, nondistended  Musculoskeletal: No cyanosis or clubbing    Data Reviewed: Basic Metabolic Panel:  Recent Labs Lab 11/04/14 2000 11/05/14 0504  NA 135 137  K 3.6 3.2*  CL 94* 98  CO2 32 30  GLUCOSE 207* 163*  BUN 15 17  CREATININE 0.62 0.51  CALCIUM 8.2* 8.4  MG 2.1  --   PHOS 3.0  --    Liver Function  Tests:  Recent Labs Lab 11/04/14 2000  AST 8  ALT 11  ALKPHOS 80  BILITOT 0.8  PROT 6.1  ALBUMIN 2.7*   No results for input(s): LIPASE, AMYLASE in the last 168 hours. No results for input(s): AMMONIA in the last 168 hours. CBC:  Recent Labs Lab 11/04/14 2000 11/05/14 0504  WBC 27.5* 25.6*  NEUTROABS 26.9*  --   HGB 10.9* 10.4*  HCT 34.4* 32.7*  MCV 75.9* 76.2*  PLT 322 327   Cardiac Enzymes: No results for input(s): CKTOTAL, CKMB, CKMBINDEX, TROPONINI in the last 168 hours. BNP (last 3 results) No results for input(s): BNP in the last 8760 hours.  ProBNP (last 3 results) No results for input(s): PROBNP in the last 8760 hours.  CBG: No results for input(s): GLUCAP in the last 168 hours.  No results found for this or any previous visit (from the past 240 hour(s)).   Studies: Portable Chest 1 View  11/04/2014   CLINICAL DATA:  COPD and lung cancer.  Headache.  EXAM: PORTABLE CHEST - 1 VIEW  COMPARISON:  11/04/2014  FINDINGS: Again noted is a large density in the left upper lung compatible with a mass. Persistent densities at the left lung base suggest pleural fluid and volume loss. Coarse lung markings compatible with chronic changes and emphysema. Heart and mediastinum appear to be stable. Negative for a pneumothorax.  IMPRESSION: Stable chest radiograph  findings.  Stable volume loss in the left hemithorax. Again noted is a large lesion involving the left upper lobe and suspect left pleural fluid.   Electronically Signed   By: Markus Daft M.D.   On: 11/04/2014 21:03    Scheduled Meds: . dexamethasone  10 mg Intravenous 4 times per day  . feeding supplement (RESOURCE BREEZE)  1 Container Oral BID BM  . levothyroxine  25 mcg Oral QAC breakfast  . multivitamin with minerals  1 tablet Oral Daily  . pantoprazole  40 mg Oral Q1200  . piperacillin-tazobactam  3.375 g Intravenous 3 times per day  . potassium chloride  40 mEq Oral Once  . temazepam  7.5 mg Oral QHS    Continuous Infusions: . sodium chloride 50 mL/hr at 11/04/14 2239    Time spent: > 35 minutes    Velvet Bathe  [facility] Pager [phone]  If 7PM-7AM, please contact night-coverage at [email or web address], password [email or web address] 11/05/2014, 3:12 PM  LOS: 1 day

## 2014-11-05 NOTE — Consult Note (Signed)
[name] [name]  Telephone:([phone]) [place] NOTE  Sherleen Pangborn                                MR#: [id number]  DOB: Jun 20, 1941                       CSN#: [id number]  Referring MD: Dr. Sheliah [facility]  Primary MD: No PCP  Reason for Consult:  Metastatic Lung Cancer to Brain  I have seen the patient, examined her and edited the notes as follows  MWN:UUVOZ [name] [name] is a 74 y.o. female smoker with a recent diagnosis of  Lung cancer per biopsy at [facility] in [place] on 10/31/14 (no details available at this time), admitted on 3/15 after being found on the floor by family members. She reports loss of consciousness. No urine or stool incontinence. She had been experiencing confusion over the last month, as well as severe parietal and right temporal headaches during that period as well. She reported trouble finding simple words. No vision changes. Upon evaluation at [facility], a CT of the head was performed which is unavailable for review at this time, not yet transferred. Per chart report, this showed metastatic brain lesions. She was placed on IV Decadron with improvement of her headaches and mentals status. A chest x ray showed stable COPD changes, with again noted a left upper lobe large lesion and left pleural effusion No other workup has been performed to date for review.  She denies any worsening shortness of breath, no chest pain or palpitations. She denies any abdominal pain, nausea or vomiting. No diarrhea or constipation. She reports a 20 lb weight loss for about 1 month, with decreased appetite. She denies any difficulty with ambulation.   We were requested to see this patient with recommendations.        PMH:  Past Medical History  Diagnosis Date  . COPD (chronic obstructive pulmonary disease)   . Stroke 11/03/14    per family  . Pneumonia Feb 2016  . Headache   . Cancer Mar. 2016    primary lung with mets to brain     Surgeries:  Past Surgical History  Procedure Laterality Date  . No past surgeries      Allergies:  Allergies  Allergen Reactions  . Bupropion Hcl     REACTION: hives  . Codeine     REACTION: nausea but can tolerate with food    Medications:  Scheduled Meds: . dexamethasone  10 mg Intravenous 4 times per day  . feeding supplement (ENSURE COMPLETE)  237 mL Oral BID BM  . Influenza vac split quadrivalent PF  0.5 mL Intramuscular Tomorrow-1000  . levothyroxine  25 mcg Oral QAC breakfast  . multivitamin with minerals  1 tablet Oral Daily  . pantoprazole  40 mg Oral Q1200  . piperacillin-tazobactam  3.375 g Intravenous 3 times per day  . potassium chloride  40 mEq Oral Once   Continuous Infusions: . sodium chloride 50 mL/hr at 11/04/14 2239   PRN Meds:.acetaminophen **OR** acetaminophen, alum & mag hydroxide-simeth, guaiFENesin-dextromethorphan, HYDROcodone-acetaminophen, ipratropium-albuterol, morphine injection, ondansetron **OR** ondansetron (ZOFRAN) IV, polyethylene glycol, zolpidem   ROS: Constitutional: Denies fevers, chills or abnormal night sweats Eyes: Denies blurriness of vision, double vision or watery eyes Ears, nose, mouth, throat, and face: Denies mucositis or sore throat Respiratory: Denies cough,  worsening dyspnea or wheezes Cardiovascular: Denies palpitation, chest discomfort or lower extremity swelling Gastrointestinal:  Denies nausea, heartburn or change in bowel habits.She denies abdominal pain. She has lost 20 lbs in 1 month with decreased appetite. Skin: Denies abnormal skin rashes Lymphatics: Denies new lymphadenopathy or easy bruising Neurological: Denies numbness, tingling or new weaknesses. Confusion,  Trouble finding words, and loss of consciousness, headaches for 1 month are new symptoms to her. Behavioral/Psych: Mood is stable, no new changes  All other systems were reviewed with the patient and are negative.  Family History:   Mother had  melanoma Father died with stroke, history of Alzheimer's 1 daughter with colon cancer, currently active patient of Dr. Alvy Bimler  Social History: Divorced. Lives with daughter. Smokes 1ppd cigarettes for 45 years, now decreased to 4-5 cigs a day. No alcohol or recreational drug use. Apart[employer].   Physical Exam   ECOG PERFORMANCE STATUS: 2  Filed Vitals:   11/05/14 0606  BP: 111/48  Pulse: 71  Temp: 97.7 F (36.5 C)  Resp: 18   Filed Weights   December 03, 2014 1700  Weight: 122 lb (55.339 kg)    GENERAL:alert, no distress and comfortable SKIN: skin color pale, texture, turgor are dry, no rashes or significant lesions EYES: normal, conjunctiva are pink and non-injected, sclera clear OROPHARYNX:no exudate, no erythema and lips, buccal mucosa, and tongue normal  NECK: supple, thyroid normal size, non-tender, without nodularity LYMPH:  no palpable lymphadenopathy in the cervical, axillary or inguinal LUNGS: essentially clear to auscultation with some atelectatic sounds, with normal breathing effort HEART: regular rate & rhythm and no murmurs and no lower extremity edema ABDOMEN:abdomen soft, non-tender and normal bowel sounds Musculoskeletal:no cyanosis of digits and no clubbing  PSYCH: alert & oriented x 3 with fluent speech NEURO: no focal motor except some trouble with word selection/ no sensory deficits. Gait not tested  CBC  Recent Labs Lab 12/03/14 2000 11/05/14 0504  WBC 27.5* 25.6*  HGB 10.9* 10.4*  HCT 34.4* 32.7*  PLT 322 327  MCV 75.9* 76.2*  MCH 24.1* 24.2*  MCHC 31.7 31.8  RDW 16.4* 16.6*  LYMPHSABS 0.3*  --   MONOABS 0.3  --   EOSABS 0.0  --   BASOSABS 0.0  --     Anemia panel:  No results for input(s): VITAMINB12, FOLATE, FERRITIN, TIBC, IRON, RETICCTPCT in the last 72 hours.  CMP    Recent Labs Lab 12-03-2014 2000 11/05/14 0504  NA 135 137  K 3.6 3.2*  CL 94* 98  CO2 32 30  GLUCOSE 207* 163*  BUN 15 17  CREATININE 0.62 0.51   CALCIUM 8.2* 8.4  MG 2.1  --   AST 8  --   ALT 11  --   ALKPHOS 80  --   BILITOT 0.8  --         Component Value Date/Time   BILITOT 0.8 Dec 03, 2014 2000      Recent Labs Lab 03-Dec-2014 2000  INR 1.09    No results for input(s): DDIMER in the last 72 hours.  Imaging Studies:  Portable Chest 1 View  December 03, 2014   CLINICAL DATA:  COPD and lung cancer.  Headache.  EXAM: PORTABLE CHEST - 1 VIEW  COMPARISON:  12-03-2014  FINDINGS: Again noted is a large density in the left upper lung compatible with a mass. Persistent densities at the left lung base suggest pleural fluid and volume loss. Coarse lung markings compatible with chronic changes and emphysema. Heart and mediastinum  appear to be stable. Negative for a pneumothorax.  IMPRESSION: Stable chest radiograph findings.  Stable volume loss in the left hemithorax. Again noted is a large lesion involving the left upper lobe and suspect left pleural fluid.   Electronically Signed   By: Markus Daft M.D.   On: 11/04/2014 21:03   Assessment/Plan: 74 y.o.   Metastatic Lung Cancer to the brain Patient was admitted to hospital after found to have metastatic brain lesions She carries a recent diagnosis of Lung Cancer, per biopsy at [facility] in [facility] on 10/31/14 She is on Decadron, please continue She may benefit from [place] for seizure prophylaxis Will need to obtain as soon as possible all records, including biopsy report, CT head, and [place] records to help in treatment of this patient. If not received by later today, she will need to repeat imaging, with MRI brain. 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

## 2014-11-05 NOTE — Care Management Note (Addendum)
    Page 1 of 1   11/12/2014     12:41:17 PM CARE MANAGEMENT NOTE 11/12/2014  Patient:  [name] [name],[name] [name]   Account Number:  [id number]  Date Initiated:  11/05/2014  Documentation initiated by:  Dessa Phi  Subjective/Objective Assessment:   74 y/o f admitted w/PNA, COPD. HF:GBMS Ca.     Action/Plan:   From home.   Anticipated DC Date:  11/12/2014   Anticipated DC Plan:  [place]  CM consult      Choice offered to / List presented to:             Status of service:  Completed, signed off Medicare Important Message given?   (If response is "NO", the following Medicare IM given date fields will be blank) Date Medicare IM given:   Medicare IM given by:   Date Additional Medicare IM given:   Additional Medicare IM given by:    Discharge Disposition:  [place]  Per UR Regulation:  Reviewed for med. necessity/level of care/duration of stay  If discussed at [place] of Stay Meetings, dates discussed:    Comments:  11/12/14 Dessa Phi RN BSN NCM [phone] d/c [address].  11/11/14 Dessa Phi RN BSN NCM [phone] Palliative cons-[facility]-await recommendations.  11/05/14 Dessa Phi RN BSN NCM [phone] Recommend PT cons.

## 2014-11-05 NOTE — Progress Notes (Signed)
INITIAL NUTRITION ASSESSMENT  DOCUMENTATION CODES Per approved criteria  -Severe malnutrition in the context of chronic illness  Pt meets criteria for severe MALNUTRITION in the context of chronic illness as evidenced by severe fat depletion, energy intake <75% or >/=1 month and 11% weight loss x 1 month.  INTERVENTION: D/C Ensure Provide Resource Breeze po BID, each supplement provides 250 kcal and 9 grams of protein Provide daily snack Encourage PO intake  NUTRITION DIAGNOSIS: Unintentional weight loss related to lung Ca w/ brain mets as evidenced by 13 lb weight loss x 1 month.   Goal: Pt to meet >/= 90% of their estimated nutrition needs   Monitor:  PO and supplemental intake, weight, labs, I/O's  Reason for Assessment: Pt identified as at nutrition risk on the Malnutrition Screen Tool  Admitting Dx: Metastatic Lung Cancer to Brain  ASSESSMENT: 74 y.o. female smoker with a recent diagnosis of Lung cancer per biopsy at [facility] in [facility] on 10/31/14 (no details available at this time), admitted on 3/15 after being found on the floor by family members. She reports loss of consciousness.  Pt in room with family bedside. Pt reports eating well and good appetite. PO intake: 50% of regular diet. Pt states that she was living on crackers and water over the last month.  Pt reports 20 lb weight loss over the last month. Based on pt's UBW, pt has lost 13 lb over the last month (11% weight loss x 1 month, significant for time frame).  Pt has been ordered Ensure BID. Pt states that she doesn't like "milky drinks", pt is willing to try [employer]. RD to order. RD to order daily snack of yogurt.   Nutrition Focused Physical Exam:  Subcutaneous Fat:  Orbital Region: WNL Upper Arm Region: severe depletion Thoracic and Lumbar Region: NA  Muscle:  Temple Region: moderate depletion Clavicle Bone Region: moderate depletion Clavicle and Acromion Bone Region: moderate  depletion Scapular Bone Region: severe depletion Dorsal Hand: mild depletion Patellar Region: NA Anterior Thigh Region: NA Posterior Calf Region: NA  Edema: no LE edema  Labs reviewed: Low K  Height: Ht Readings from Last 1 Encounters:  11/04/14 5\' 6"  (1.676 m)    Weight: Wt Readings from Last 1 Encounters:  11/04/14 122 lb (55.339 kg)    Ideal Body Weight: 130 lb  % Ideal Body Weight: 94%  Wt Readings from Last 10 Encounters:  11/04/14 122 lb (55.339 kg)  06/28/10 137 lb 6.1 oz (62.316 kg)  04/14/10 139 lb (63.05 kg)  04/05/10 132 lb (59.875 kg)    Usual Body Weight: 135-137 lb  % Usual Body Weight: 90%  BMI:  Body mass index is 19.7 kg/(m^2).  Estimated Nutritional Needs: Kcal: 1650-1850 Protein: 75-85g Fluid: 1.7L/day  Skin: intact  Diet Order: Diet regular  EDUCATION NEEDS: -No education needs identified at this time   Intake/Output Summary (Last 24 hours) at 11/05/14 1120 Last data filed at 11/05/14 0900  Gross per 24 hour  Intake  637.5 ml  Output    320 ml  Net  317.5 ml    Last BM: 3/14  Labs:   Recent Labs Lab 11/04/14 2000 11/05/14 0504  NA 135 137  K 3.6 3.2*  CL 94* 98  CO2 32 30  BUN 15 17  CREATININE 0.62 0.51  CALCIUM 8.2* 8.4  MG 2.1  --   PHOS 3.0  --   GLUCOSE 207* 163*    CBG (last 3)  No results for  input(s): GLUCAP in the last 72 hours.  Scheduled Meds: . dexamethasone  10 mg Intravenous 4 times per day  . feeding supplement (ENSURE COMPLETE)  237 mL Oral BID BM  . Influenza vac split quadrivalent PF  0.5 mL Intramuscular Tomorrow-1000  . levothyroxine  25 mcg Oral QAC breakfast  . multivitamin with minerals  1 tablet Oral Daily  . pantoprazole  40 mg Oral Q1200  . piperacillin-tazobactam  3.375 g Intravenous 3 times per day  . potassium chloride  40 mEq Oral Once    Continuous Infusions: . sodium chloride 50 mL/hr at 11/04/14 2239    Past Medical History  Diagnosis Date  . COPD (chronic  obstructive pulmonary disease)   . Stroke 11/03/14    per family  . Pneumonia Feb 2016  . Headache   . Cancer Mar. 2016    primary lung with mets to brain    Past Surgical History  Procedure Laterality Date  . No past surgeries      Clayton Bibles, MS, RD, LDN Pager: [phone] After Hours Pager: [phone]

## 2014-11-05 NOTE — Consult Note (Signed)
Radiation Oncology         [phone] ________________________________  Name: [name] [name] MRN: [id number]  Date: 11/04/2014  DOB: 07/26/1941   DIAGNOSIS: The primary encounter diagnosis was Insomnia. Diagnoses of COPD (chronic obstructive pulmonary disease) and Metastasis to brain were also pertinent to this visit.   HISTORY OF PRESENT ILLNESS::[name] [name] is a 74 y.o. female who is seen for an initial consultation visit regarding the patient's diagnosis of metastatic lung cancer with brain metastasis.  The patient presented with a recent diagnosis of lung cancer from workup in [facility]. This is per the patient. We are in the process of trying to get additional information regarding this. The patient more recently has been experiencing some confusion and was found on the floor by family members. She was brought to [facility] and a CT scan of the head revealed intracranial findings. A 3 cm lesion was seen in the right posterior parietal/occipital lobe compatible with metastasis. An area of hemorrhage within the right temporal lobe measured up to 4.5 cm. This was felt to most likely represent hemorrhage within a metastasis. Surrounding edema was present and a 1 cm right to left midline shift was present.  The patient has been transferred to [facility]. She has been placed on IV echo drawn with some improvement in headaches and mental status. The patient does not feel however that there has been a major change as of yet. She was seen today briefly as she was about to go to radiology to obtain a brain MRI scan.    PREVIOUS RADIATION THERAPY: No   PAST MEDICAL HISTORY:  has a past medical history of COPD (chronic obstructive pulmonary disease); Stroke (11/03/14); Pneumonia (Feb 2016); Headache; and Cancer (Mar. 2016).     PAST SURGICAL HISTORY: Past Surgical History  Procedure Laterality Date  . No past surgeries       FAMILY HISTORY: family  history is not on file.   SOCIAL HISTORY:  reports that she has been smoking.  She has never used smokeless tobacco. She reports that she does not drink alcohol or use illicit drugs.   ALLERGIES: Bupropion hcl and Codeine   MEDICATIONS:  Current Facility-Administered Medications  Medication Dose Route Frequency Provider Last Rate Last Dose  . 0.9 %  sodium chloride infusion   Intravenous Continuous Simbiso Ranga, MD 50 mL/hr at 11/05/14 1654    . acetaminophen (TYLENOL) tablet 650 mg  650 mg Oral Q6H PRN Simbiso Ranga, MD       Or  . acetaminophen (TYLENOL) suppository 650 mg  650 mg Rectal Q6H PRN Simbiso Ranga, MD      . alum & mag hydroxide-simeth (MAALOX/MYLANTA) 200-200-20 MG/5ML suspension 30 mL  30 mL Oral Q6H PRN Simbiso Ranga, MD      . dexamethasone (DECADRON) injection 10 mg  10 mg Intravenous 4 times per day Simbiso Ranga, MD   10 mg at 11/05/14 1125  . feeding supplement (RESOURCE BREEZE) (RESOURCE BREEZE) liquid 1 Container  1 Container Oral BID BM Clayton Bibles, RD   1 Container at 11/05/14 1400  . guaiFENesin-dextromethorphan (ROBITUSSIN DM) 100-10 MG/5ML syrup 5 mL  5 mL Oral Q4H PRN Simbiso Ranga, MD      . HYDROcodone-acetaminophen (NORCO/VICODIN) 5-325 MG per tablet 1-2 tablet  1-2 tablet Oral Q4H PRN Nat Math, MD   2 tablet at 11/04/14 2033  . ipratropium-albuterol (DUONEB) 0.5-2.5 (3) MG/3ML nebulizer solution 3 mL  3 mL Nebulization Q4H PRN  Nat Math, MD      . levothyroxine (SYNTHROID, LEVOTHROID) tablet 25 mcg  25 mcg Oral QAC breakfast Simbiso Ranga, MD   25 mcg at 11/05/14 0759  . LORazepam (ATIVAN) tablet 0.5 mg  0.5 mg Oral Q6H PRN Heath Lark, MD   0.5 mg at 11/05/14 1741  . morphine 2 MG/ML injection 2 mg  2 mg Intravenous Q3H PRN Simbiso Ranga, MD      . multivitamin with minerals tablet 1 tablet  1 tablet Oral Daily Simbiso Ranga, MD   1 tablet at 11/05/14 1022  . ondansetron (ZOFRAN) tablet 4 mg  4 mg Oral Q6H PRN Simbiso Ranga, MD       Or  .  ondansetron (ZOFRAN) injection 4 mg  4 mg Intravenous Q6H PRN Simbiso Ranga, MD      . pantoprazole (PROTONIX) EC tablet 40 mg  40 mg Oral Q1200 Simbiso Ranga, MD   40 mg at 11/05/14 1125  . piperacillin-tazobactam (ZOSYN) IVPB 3.375 g  3.375 g Intravenous 3 times per day Nat Math, MD   3.375 g at 11/05/14 1325  . polyethylene glycol (MIRALAX / GLYCOLAX) packet 17 g  17 g Oral Daily PRN Simbiso Ranga, MD      . potassium chloride 20 MEQ/15ML (10%) solution 40 mEq  40 mEq Oral Once Simbiso Ranga, MD   40 mEq at 11/04/14 2300  . temazepam (RESTORIL) capsule 7.5 mg  7.5 mg Oral QHS Ni Gorsuch, MD      . zolpidem (AMBIEN) tablet 5 mg  5 mg Oral QHS PRN Simbiso Ranga, MD         REVIEW OF SYSTEMS:  A 15 point review of systems is documented in the electronic medical record. This was obtained by the nursing staff. However, I reviewed this with the patient to discuss relevant findings and make appropriate changes.  Pertinent items are noted in HPI.    PHYSICAL EXAM:  height is 5\' 6"  (6.720 m) and weight is 122 lb (55.339 kg). Her oral temperature is 97.4 F (36.3 C). Her blood pressure is 123/52 and her pulse is 74. Her respiration is 20 and oxygen saturation is 97%.   ECOG = 2  0 - Asymptomatic (Fully active, able to carry on all predisease activities without restriction)  1 - Symptomatic but completely ambulatory (Restricted in physically strenuous activity but ambulatory and able to carry out work of a light or sedentary nature. For example, light housework, office work)  2 - Symptomatic, <50% in bed during the day (Ambulatory and capable of all self care but unable to carry out any work activities. Up and about more than 50% of waking hours)  3 - Symptomatic, >50% in bed, but not bedbound (Capable of only limited self-care, confined to bed or chair 50% or more of waking hours)  4 - Bedbound (Completely disabled. Cannot carry on any self-care. Totally confined to bed or chair)  5 -  Death   Eustace Pen MM, Creech RH, Tormey DC, et al. [phone]). "Toxicity and response criteria of the [facility]". [place] Oncol. 5 (6): 649-55     LABORATORY DATA:  Lab Results  Component Value Date   WBC 25.6* 11/05/2014   HGB 10.4* 11/05/2014   HCT 32.7* 11/05/2014   MCV 76.2* 11/05/2014   PLT 327 11/05/2014   Lab Results  Component Value Date   NA 137 11/05/2014   K 3.2* 11/05/2014   CL 98 11/05/2014   CO2 30  11/05/2014   Lab Results  Component Value Date   ALT 11 11/04/2014   AST 8 11/04/2014   ALKPHOS 80 11/04/2014   BILITOT 0.8 11/04/2014      RADIOGRAPHY: Portable Chest 1 View  11/04/2014   CLINICAL DATA:  COPD and lung cancer.  Headache.  EXAM: PORTABLE CHEST - 1 VIEW  COMPARISON:  11/04/2014  FINDINGS: Again noted is a large density in the left upper lung compatible with a mass. Persistent densities at the left lung base suggest pleural fluid and volume loss. Coarse lung markings compatible with chronic changes and emphysema. Heart and mediastinum appear to be stable. Negative for a pneumothorax.  IMPRESSION: Stable chest radiograph findings.  Stable volume loss in the left hemithorax. Again noted is a large lesion involving the left upper lobe and suspect left pleural fluid.   Electronically Signed   By: Markus Daft M.D.   On: 11/04/2014 21:03       IMPRESSION:  Metastatic lung cancer with brain metastasis. The patient has a 3 cm lesion in addition to a hemorrhagic area measuring up to 4.5 cm which likely represents a hemorrhage within metastasis as seen on CT scan.   PLAN:  -Additional medical records are being obtained. Pathology and additional aspects of her initial workup will be very helpful. -Decadron has been started. -Brain MRI scan this evening -I would recommend consultation with neurosurgery. The patient has 2 fairly large intracranial lesions with significant surrounding edema. -I anticipate radiation treatment will be a  component of her upcoming overall treatment plan. I discussed this briefly with the patient today before she headed down to radiology. Depending on her MRI scan results, radiosurgery versus whole brain radiation treatment can be discussed further.    ________________________________   Jodelle Gross, MD, PhD   **Disclaimer: This note was dictated with voice recognition software. Similar sounding words can inadvertently be transcribed and this note may contain transcription errors which may not have been corrected upon publication of note.**

## 2014-11-06 ENCOUNTER — Inpatient Hospital Stay (HOSPITAL_COMMUNITY): Payer: Commercial Managed Care - PPO

## 2014-11-06 LAB — URINE CULTURE
COLONY COUNT: NO GROWTH
Culture: NO GROWTH

## 2014-11-06 LAB — T3, FREE: T3, Free: 1 pg/mL — ABNORMAL LOW (ref 2.0–4.4)

## 2014-11-06 MED ORDER — LORAZEPAM 1 MG PO TABS
1.0000 mg | ORAL_TABLET | Freq: Four times a day (QID) | ORAL | Status: DC | PRN
Start: 2014-11-06 — End: 2014-11-10
  Administered 2014-11-06 – 2014-11-09 (×2): 1 mg via ORAL
  Filled 2014-11-06 (×2): qty 1

## 2014-11-06 MED ORDER — DEXAMETHASONE SODIUM PHOSPHATE 10 MG/ML IJ SOLN
10.0000 mg | Freq: Four times a day (QID) | INTRAMUSCULAR | Status: DC
  Administered 2014-11-06 – 2014-11-12 (×24): 10 mg via INTRAVENOUS
  Filled 2014-11-06 (×25): qty 1

## 2014-11-06 MED ORDER — GADOBENATE DIMEGLUMINE 529 MG/ML IV SOLN
10.0000 mL | Freq: Once | INTRAVENOUS | Status: AC | PRN
  Administered 2014-11-06: 10 mL via INTRAVENOUS

## 2014-11-06 NOTE — Progress Notes (Addendum)
Clinical Social Work Department BRIEF PSYCHOSOCIAL ASSESSMENT 11/06/2014  Patient:  [name],[name]     Account Number:  [id number]     [place] date:  11/05/2014  Clinical Social Worker:  [name] [name]  Date/Time:  11/06/2014 11:07 AM  Referred by:  Physician  Date Referred:  11/06/2014 Referred for  Advanced Directives   Other Referral:   Interview type:  Patient Other interview type:   and friend, [name] [name] & daughter, [name] [name] at bedside    PSYCHOSOCIAL DATA Living Status:  [place] Admitted from facility:   Level of care:   Primary support name:  [name] [name] (sister) h#: [id number] w#: [phone] Primary support relationship to patient:  SIBLING Degree of support available:   good    CURRENT CONCERNS Current Concerns  Other - See comment  Post-Acute Placement   Other Concerns:   Advance Directives    SOCIAL WORK ASSESSMENT / PLAN CSW received referral from RN, [name] [name] that patient would like to complete Healthcare POA.   Assessment/plan status:  Information/Referral to [employer] Other assessment/ plan:   Information/referral to community resources:   CSW reviewed advance directives with patient & friend, [name] [name] at bedside.    PATIENT'S/FAMILY'S RESPONSE TO PLAN OF CARE: CSW assisted patient/family in completing Advance Directives. The patient designated her sister, [name] [name] as her primary healthcare agent.  Patient wished not to complete the living will at this time, stating that [name] [name] is well aware of her wishes.    Clinical Social Worker notarized documents and made copies for patient/family. Clinical Social Worker placed copy on shadow chart to be scanned into patient's chart.    CSW reviewed PT evaluation recommending HH Supervision/Assistance - 24 hour vs. SNF. CSW spoke with patient re: discharge planning. Patient informed CSW that she lives with her daughter, [name] [name] who was also diagnosed with cancer  & going through chemo treatments with [name] [name]. Patient states that her grandaughter also lives with her. Patient's main concern at this time is getting her affairs in order, trying to reach a lawyer to complete a will. CSW will follow-up re: discharge planning.    Clinical Social Worker encouraged patient/family to contact with any additional questions or concerns.         [name] [name], [name] Clinical Social Worker cell #: [phone]

## 2014-11-06 NOTE — Progress Notes (Signed)
[name] [name]   DOB:09/13/1940   QV#:[id number]   PPI#:[id number]  No care team member to display  I have seen the patient, examined her and edited the notes as follows  Subjective: Patient seen and examined. Afebrile. Denies fevers, chills, night sweats, vision changes, or mucositis. Denies any respiratory complaints. Denies any chest pain or palpitations. Denies lower extremity swelling. Denies nausea, heartburn or change in bowel habits. Appetite is normal. Denies any dysuria. Denies abnormal skin rashes, or neuropathy. Denies any bleeding issues such as epistaxis, hematemesis, hematuria or hematochezia.  She has intermittent right sided headache. Ambulating without difficulty.  Scheduled Meds: . feeding supplement (RESOURCE BREEZE)  1 Container Oral BID BM  . levothyroxine  25 mcg Oral QAC breakfast  . multivitamin with minerals  1 tablet Oral Daily  . pantoprazole  40 mg Oral Q1200  . piperacillin-tazobactam  3.375 g Intravenous 3 times per day  . potassium chloride  40 mEq Oral Once  . temazepam  7.5 mg Oral QHS   Continuous Infusions: . sodium chloride 50 mL/hr at 11/05/14 1654   PRN Meds:acetaminophen **OR** acetaminophen, alum & mag hydroxide-simeth, guaiFENesin-dextromethorphan, HYDROcodone-acetaminophen, ipratropium-albuterol, LORazepam, morphine injection, ondansetron **OR** ondansetron (ZOFRAN) IV, polyethylene glycol, zolpidem   Objective:  Filed Vitals:   11/06/14 0629  BP: 122/54  Pulse: 65  Temp: 97.8 F (36.6 C)  Resp: 20      Intake/Output Summary (Last 24 hours) at 11/06/14 0630 Last data filed at 11/06/14 1601  Gross per 24 hour  Intake 1094.17 ml  Output    700 ml  Net 394.17 ml     GENERAL:alert, no distress and comfortable SKIN: skin color, texture, turgor are normal, no rashes or significant lesions EYES: normal, conjunctiva are pink and non-injected, sclera clear OROPHARYNX:no exudate, no erythema and lips, buccal mucosa, and tongue normal  NECK:  supple, thyroid normal size, non-tender, without nodularity LYMPH:  no palpable lymphadenopathy in the cervical, axillary or inguinal LUNGS: clear to auscultation and percussion with normal breathing effort HEART: regular rate & rhythm and no murmurs and no lower extremity edema ABDOMEN: soft, non-tender and normal bowel sounds Musculoskeletal:no cyanosis of digits and no clubbing  PSYCH: alert & oriented x 3 with fluent speech NEURO: no focal motor/sensory deficits    CBG (last 3)  No results for input(s): GLUCAP in the last 72 hours.   Labs:   Recent Labs Lab 11/04/14 2000 11/05/14 0504  WBC 27.5* 25.6*  HGB 10.9* 10.4*  HCT 34.4* 32.7*  PLT 322 327  MCV 75.9* 76.2*  MCH 24.1* 24.2*  MCHC 31.7 31.8  RDW 16.4* 16.6*  LYMPHSABS 0.3*  --   MONOABS 0.3  --   EOSABS 0.0  --   BASOSABS 0.0  --      Chemistries:    Recent Labs Lab 11/04/14 2000 11/05/14 0504  NA 135 137  K 3.6 3.2*  CL 94* 98  CO2 32 30  GLUCOSE 207* 163*  BUN 15 17  CREATININE 0.62 0.51  CALCIUM 8.2* 8.4  MG 2.1  --   AST 8  --   ALT 11  --   ALKPHOS 80  --   BILITOT 0.8  --     GFR Estimated Creatinine Clearance: 54.7 mL/min (by C-G formula based on Cr of 0.51).  Liver Function Tests:  Recent Labs Lab 11/04/14 2000  AST 8  ALT 11  ALKPHOS 80  BILITOT 0.8  PROT 6.1  ALBUMIN 2.7*       Imaging  Studies:  Portable Chest 1 View  11-27-14   CLINICAL DATA:  COPD and lung cancer.  Headache.  EXAM: PORTABLE CHEST - 1 VIEW  COMPARISON:  Nov 27, 2014  FINDINGS: Again noted is a large density in the left upper lung compatible with a mass. Persistent densities at the left lung base suggest pleural fluid and volume loss. Coarse lung markings compatible with chronic changes and emphysema. Heart and mediastinum appear to be stable. Negative for a pneumothorax.  IMPRESSION: Stable chest radiograph findings.  Stable volume loss in the left hemithorax. Again noted is a large lesion involving the  left upper lobe and suspect left pleural fluid.   Electronically Signed   By: Markus Daft M.D.   On: 2014/11/27 21:03    Assessment/Plan: 74 y.o.  Metastatic Lung Cancer to the brain Patient was admitted to hospital after found to have metastatic brain lesions She carries a recent diagnosis of Lung Cancer, per biopsy at [facility] on 10/31/14 She is on Decadron, please continue She may benefit from [place] for seizure prophylaxis Awaiting records from  [facility] She may benefit from PET/CT outpatient to further evaluate for metastatic disease Radiation Oncology ordered MRI brain on 3/16 but patient developed claustrophobia and had to be aborted. She wishes to repeat test today, ativan prn is prescribed  Neurosurgery consultation was recommended by Dr. 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 Similar sounding words can inadvertently be transcribed and this note may contain transcription errors which may not have been corrected upon publication of note.Sharene Butters E, PA-C 11/06/2014  8:07 AM Trask Vosler, MD 11/06/2014

## 2014-11-06 NOTE — Progress Notes (Signed)
[facility] PROGRESS NOTE  [name] [name] RXV:[id number] DOB: 08-19-1941 DOA: 11/04/2014 PCP: No primary care provider on file.  Assessment/Plan:    Pneumonia due to anaerobes - Patient is currently on Zosyn  Active Problems:   Lung cancer - Oncologist consulted    Brain metastases - Continue decadron - Radiologist/oncologist consulted - reordered MRI of brain. Will plan on administering higher dose of anxiolytic agent. - discussed with pt the possibility to start antiepileptic medication. Oncology assisting with this decision.    COPD (chronic obstructive pulmonary disease) - Stable continue bronchodilators as needed     Leucocytosis - Patient is currently on Decadron and there is a report she may have received some prior to admission    Hypothyroidism - Synthroid as patient had elevated TSH upon initial evaluation. - Will need repeat TSH levels in 4-6 weeks as outpatient    Protein-calorie malnutrition, severe - RD on board - Agree with liberalize his diet to regular diet  Code Status: full Family Communication: none at bedside Disposition Plan: Pending further recommendations from specialist involved   Consultants:  Oncology  Rad Onc  Antibiotics:  Zosyn  HPI/Subjective: Pt has no new complaints. No acute issues overnight.  Objective: Filed Vitals:   11/06/14 1343  BP: 100/80  Pulse: 86  Temp: 98.1 F (36.7 C)  Resp: 18    Intake/Output Summary (Last 24 hours) at 11/06/14 1346 Last data filed at 11/06/14 5093  Gross per 24 hour  Intake 734.17 ml  Output    400 ml  Net 334.17 ml   Filed Weights   11/04/14 1700  Weight: 55.339 kg (122 lb)    Exam:   General:  Pt in nad, alert nad awake  Cardiovascular: RRR, no mrg  Respiratory: Clear to auscultation bilaterally, no wheezes, equal chest rise  Abdomen: Soft, nondistended  Musculoskeletal: No cyanosis or clubbing    Data Reviewed: Basic Metabolic Panel:  Recent Labs Lab  11/04/14 2000 11/05/14 0504  NA 135 137  K 3.6 3.2*  CL 94* 98  CO2 32 30  GLUCOSE 207* 163*  BUN 15 17  CREATININE 0.62 0.51  CALCIUM 8.2* 8.4  MG 2.1  --   PHOS 3.0  --    Liver Function Tests:  Recent Labs Lab 11/04/14 2000  AST 8  ALT 11  ALKPHOS 80  BILITOT 0.8  PROT 6.1  ALBUMIN 2.7*   No results for input(s): LIPASE, AMYLASE in the last 168 hours. No results for input(s): AMMONIA in the last 168 hours. CBC:  Recent Labs Lab 11/04/14 2000 11/05/14 0504  WBC 27.5* 25.6*  NEUTROABS 26.9*  --   HGB 10.9* 10.4*  HCT 34.4* 32.7*  MCV 75.9* 76.2*  PLT 322 327   Cardiac Enzymes: No results for input(s): CKTOTAL, CKMB, CKMBINDEX, TROPONINI in the last 168 hours. BNP (last 3 results) No results for input(s): BNP in the last 8760 hours.  ProBNP (last 3 results) No results for input(s): PROBNP in the last 8760 hours.  CBG: No results for input(s): GLUCAP in the last 168 hours.  Recent Results (from the past 240 hour(s))  Culture, Urine     Status: None   Collection Time: 11/05/14  5:25 AM  Result Value Ref Range Status   Specimen Description URINE, CLEAN CATCH  Final   Special Requests NONE  Final   Colony Count NO GROWTH Performed at [facility]   Final   Culture NO GROWTH Performed at [facility]   Final  Report Status 11/06/2014 FINAL  Final     Studies: Portable Chest 1 View  11/04/2014   CLINICAL DATA:  COPD and lung cancer.  Headache.  EXAM: PORTABLE CHEST - 1 VIEW  COMPARISON:  11/04/2014  FINDINGS: Again noted is a large density in the left upper lung compatible with a mass. Persistent densities at the left lung base suggest pleural fluid and volume loss. Coarse lung markings compatible with chronic changes and emphysema. Heart and mediastinum appear to be stable. Negative for a pneumothorax.  IMPRESSION: Stable chest radiograph findings.  Stable volume loss in the left hemithorax. Again noted is a large lesion involving the  left upper lobe and suspect left pleural fluid.   Electronically Signed   By: Markus Daft M.D.   On: 11/04/2014 21:03    Scheduled Meds: . feeding supplement (RESOURCE BREEZE)  1 Container Oral BID BM  . levothyroxine  25 mcg Oral QAC breakfast  . multivitamin with minerals  1 tablet Oral Daily  . pantoprazole  40 mg Oral Q1200  . piperacillin-tazobactam  3.375 g Intravenous 3 times per day  . potassium chloride  40 mEq Oral Once  . temazepam  7.5 mg Oral QHS   Continuous Infusions: . sodium chloride 50 mL/hr at 11/06/14 1345    Time spent: > 35 minutes    Velvet Bathe  [facility] Pager [phone]  If 7PM-7AM, please contact night-coverage at [email or web address], password [facility] 11/06/2014, 1:46 PM  LOS: 2 days

## 2014-11-06 NOTE — Evaluation (Signed)
Physical Therapy Evaluation Patient Details Name: Nelva Hauk MRN: [id number] DOB: 1941/03/10 Today's Date: 11/06/2014   History of Present Illness  74 yo female admitted with fall at home, lung cancer with brain mets. Hx of  COPD, lung cancer diagnosed 10/2014, CVA.   Clinical Impression  On eval, pt required Min assist for mobility-able to ambulate ~75 feet with RW. L side appears slightly weaker than R during MMT. Pt slow to process/follow commands. Noted slightly lean to L side and pt tended to veer into objects in environment on L side as well. Pt states she lives with daughter/granddaughter but that daughter is currently undergoing cancer treatment. Pt could potentially d/c home if she will have adequate 24 hour care and family can provide at least Min assist for safe mobility. If this is not an option, then will need to consider ST rehab at [facility].     Follow Up Recommendations Home health PT;Supervision/Assistance - 24 hour (will absolutely need 24 hour care. If this is not an option need to consider ST rehab at [facility])    Equipment Recommendations  None recommended by PT (pt states she has access to DME)    Recommendations for Other Services OT consult     Precautions / Restrictions Precautions Precautions: Fall Restrictions Weight Bearing Restrictions: No      Mobility  Bed Mobility Overal bed mobility: Needs Assistance Bed Mobility: Supine to Sit     Supine to sit: Min guard     General bed mobility comments: close guard for safety  Transfers Overall transfer level: Needs assistance Equipment used: Rolling walker (2 wheeled) Transfers: Sit to/from Stand Sit to Stand: Min assist         General transfer comment: Assist to rise, stabilize, control descent. Multimodal cues for safety, technique, hand placement  Ambulation/Gait Ambulation/Gait assistance: Min assist Ambulation Distance (Feet): 75 Feet (75'x1, 60'x1) Assistive device: Rolling walker (2 wheeled) Gait  Pattern/deviations: Step-through pattern;Trunk flexed     General Gait Details: Assist to stabilize pt and maneuver safely with walker. Fatigues fairly easily. Dyspnea 2-3/4 requiring seated rest break.   Stairs            Wheelchair Mobility    Modified Rankin (Stroke Patients Only)       Balance Overall balance assessment: Needs assistance         Standing balance support: Bilateral upper extremity supported;During functional activity Standing balance-Leahy Scale: Poor Standing balance comment: slight lean/favoring to L side.                              Pertinent Vitals/Pain Pain Assessment: 0-10 Pain Score: 6  Pain Location: headache    Home Living Family/patient expects to be discharged to:: Private residence Living Arrangements: Children;Other relatives (granddaughter) Available Help at Discharge: Family Type of Home: Apartment (townhome) Home Access: Level entry     Home Layout: Two level;Bed/bath upstairs Home Equipment: Walker - 2 wheels;Cane - single point      Prior Function Level of Independence: Independent               Hand Dominance        Extremity/Trunk Assessment   Upper Extremity Assessment: Generalized weakness           Lower Extremity Assessment: RLE deficits/detail;LLE deficits/detail RLE Deficits / Details: At least 4/5 throughout LLE Deficits / Details: appears slightly weaker than R side. At least 3+/5 throughout  Cervical / Trunk  Assessment: Kyphotic  Communication   Communication: No difficulties  Cognition Arousal/Alertness: Awake/alert Behavior During Therapy: Flat affect Overall Cognitive Status: No family/caregiver present to determine baseline cognitive functioning Area of Impairment: Orientation;Memory;Following commands;Safety/judgement;Problem solving Orientation Level: Situation;Place   Memory: Decreased short-term memory Following Commands: Follows one step commands with increased  time Safety/Judgement: Decreased awareness of deficits   Problem Solving: Requires tactile cues;Requires verbal cues;Slow processing      General Comments      Exercises        Assessment/Plan    PT Assessment Patient needs continued PT services  PT Diagnosis Difficulty walking;Generalized weakness;Acute pain;Altered mental status   PT Problem List Decreased strength;Decreased activity tolerance;Decreased balance;Decreased mobility;Decreased knowledge of use of DME;Decreased cognition;Pain;Decreased safety awareness  PT Treatment Interventions DME instruction;Gait training;Functional mobility training;Therapeutic activities;Therapeutic exercise;Patient/family education;Balance training   PT Goals (Current goals can be found in the Care Plan section) Acute Rehab PT Goals Patient Stated Goal: none stated PT Goal Formulation: With patient Time For Goal Achievement: 11/20/14 Potential to Achieve Goals: Good    Frequency Min 3X/week   Barriers to discharge   daughter    Co-evaluation               End of Session Equipment Utilized During Treatment: Gait belt Activity Tolerance: Patient limited by fatigue Patient left: in bed;with call bell/phone within reach;with family/visitor present (sitting EOB)           Time: 0623-7628 PT Time Calculation (min) (ACUTE ONLY): 33 min   Charges:   PT Evaluation $Initial PT Evaluation Tier I: 1 Procedure PT Treatments $Gait Training: 8-22 mins   PT G Codes:        Weston Anna, MPT Pager: [phone]

## 2014-11-07 ENCOUNTER — Inpatient Hospital Stay: Payer: Commercial Managed Care - PPO

## 2014-11-07 ENCOUNTER — Encounter (HOSPITAL_COMMUNITY): Payer: Self-pay | Admitting: Physical Medicine and Rehabilitation

## 2014-11-07 MED ORDER — SODIUM CHLORIDE 0.9 % IJ SOLN
3.0000 mL | Freq: Two times a day (BID) | INTRAMUSCULAR | Status: DC
  Administered 2014-11-07 – 2014-11-12 (×11): 3 mL via INTRAVENOUS

## 2014-11-07 MED ORDER — POTASSIUM CHLORIDE CRYS ER 20 MEQ PO TBCR
40.0000 meq | EXTENDED_RELEASE_TABLET | Freq: Once | ORAL | Status: AC
  Administered 2014-11-07: 40 meq via ORAL
  Filled 2014-11-07: qty 2

## 2014-11-07 MED ORDER — AMOXICILLIN-POT CLAVULANATE 875-125 MG PO TABS
1.0000 | ORAL_TABLET | Freq: Two times a day (BID) | ORAL | Status: DC
  Administered 2014-11-07 – 2014-11-11 (×7): 1 via ORAL
  Filled 2014-11-07 (×11): qty 1

## 2014-11-07 NOTE — Progress Notes (Signed)
[facility] PROGRESS NOTE  [name] [name] OMV:[id number] DOB: 04-20-1941 DOA: 11/04/2014 PCP: No primary care provider on file.  Assessment/Plan:    Pneumonia due to anaerobes - Patient is currently on Zosyn  Active Problems:   Lung cancer - Oncologist consulted - supportive therapy    Brain metastases - Continue decadron - Radiologist/oncologist consulted - MRI obtained - Pt on antiepileptic medication.  - Awaiting further decision making oncology discussed case with neurosurgery  Hypokalemia - replace orally    COPD (chronic obstructive pulmonary disease) - Stable continue bronchodilators as needed     Leucocytosis - Patient is currently on Decadron and there is a report she may have received some prior to admission    Hypothyroidism - Synthroid as patient had elevated TSH upon initial evaluation. - Will need repeat TSH levels in 4-6 weeks as outpatient    Protein-calorie malnutrition, severe - RD on board - Agree with liberalize his diet to regular diet  Code Status: full Family Communication: none at bedside Disposition Plan: Pending further recommendations from specialist involved   Consultants:  Oncology  Rad Onc  Antibiotics:  Zosyn  HPI/Subjective: Pt has no new complaints. No acute issues overnight.  Objective: Filed Vitals:   11/07/14 0647  BP: 134/56  Pulse: 55  Temp: 97.5 F (36.4 C)  Resp: 18    Intake/Output Summary (Last 24 hours) at 11/07/14 1136 Last data filed at 11/07/14 1029  Gross per 24 hour  Intake    390 ml  Output    350 ml  Net     40 ml   Filed Weights   11/04/14 1700  Weight: 55.339 kg (122 lb)    Exam:   General:  Pt in nad, alert nad awake  Cardiovascular: RRR, no mrg  Respiratory: Clear to auscultation bilaterally, no wheezes, equal chest rise  Abdomen: Soft, nondistended  Musculoskeletal: No cyanosis or clubbing    Data Reviewed: Basic Metabolic Panel:  Recent Labs Lab 11/04/14 2000  11/05/14 0504  NA 135 137  K 3.6 3.2*  CL 94* 98  CO2 32 30  GLUCOSE 207* 163*  BUN 15 17  CREATININE 0.62 0.51  CALCIUM 8.2* 8.4  MG 2.1  --   PHOS 3.0  --    Liver Function Tests:  Recent Labs Lab 11/04/14 2000  AST 8  ALT 11  ALKPHOS 80  BILITOT 0.8  PROT 6.1  ALBUMIN 2.7*   No results for input(s): LIPASE, AMYLASE in the last 168 hours. No results for input(s): AMMONIA in the last 168 hours. CBC:  Recent Labs Lab 11/04/14 2000 11/05/14 0504  WBC 27.5* 25.6*  NEUTROABS 26.9*  --   HGB 10.9* 10.4*  HCT 34.4* 32.7*  MCV 75.9* 76.2*  PLT 322 327   Cardiac Enzymes: No results for input(s): CKTOTAL, CKMB, CKMBINDEX, TROPONINI in the last 168 hours. BNP (last 3 results) No results for input(s): BNP in the last 8760 hours.  ProBNP (last 3 results) No results for input(s): PROBNP in the last 8760 hours.  CBG: No results for input(s): GLUCAP in the last 168 hours.  Recent Results (from the past 240 hour(s))  Culture, Urine     Status: None   Collection Time: 11/05/14  5:25 AM  Result Value Ref Range Status   Specimen Description URINE, CLEAN CATCH  Final   Special Requests NONE  Final   Colony Count NO GROWTH Performed at [facility]   Final   Culture NO GROWTH Performed at [facility]   Final   Report Status Nov 21, 2014 FINAL  Final     Studies: Mr Kizzie Fantasia Contrast  2014-11-21   CLINICAL DATA:  Lung cancer and brain metastases.  EXAM: MRI HEAD WITHOUT AND WITH CONTRAST  TECHNIQUE: Multiplanar, multiecho pulse sequences of the brain and surrounding structures were obtained without and with intravenous contrast.  CONTRAST:  8mL MULTIHANCE GADOBENATE DIMEGLUMINE 529 MG/ML IV SOLN  COMPARISON:  Head CT 11/04/2014  FINDINGS: Images are mildly degraded by motion artifact. There is no evidence of acute infarct or extra-axial fluid collection.  Hemorrhagic right temporal lobe mass measures 5.4 x 4.6 x 4.3 cm with extensive surrounding  vasogenic edema. 10 mm of leftward midline shift is unchanged. Hemorrhagic right occipital lobe mass measures 3.7 x 2.5 cm with mild surrounding edema. Peripherally enhancing left temporal lobe mass measures 2.0 x 1.6 cm with mild surrounding edema. There is a 9 mm solidly enhancing mass in the left cerebellum with mild surrounding edema. There is right uncal herniation. There is no evidence of hydrocephalus.  Orbits are unremarkable. Paranasal sinuses and mastoid air cells are clear. Major intracranial vascular flow voids are preserved.  IMPRESSION: Four brain metastases, the largest being a 5.4 cm hemorrhagic right temporal lobe metastasis with extensive surrounding edema and 10 mm of unchanged midline shift.   Electronically Signed   By: Logan Bores   On: 2014-11-21 19:50    Scheduled Meds: . dexamethasone  10 mg Intravenous 4 times per day  . feeding supplement (RESOURCE BREEZE)  1 Container Oral BID BM  . levothyroxine  25 mcg Oral QAC breakfast  . multivitamin with minerals  1 tablet Oral Daily  . pantoprazole  40 mg Oral Q1200  . piperacillin-tazobactam  3.375 g Intravenous 3 times per day  . potassium chloride  40 mEq Oral Once  . sodium chloride  3 mL Intravenous Q12H  . temazepam  7.5 mg Oral QHS   Continuous Infusions:    Time spent: > 35 minutes    Velvet Bathe  [facility] Pager [phone]  If 7PM-7AM, please contact night-coverage at [email or web address], password [facility] 11/07/2014, 11:36 AM  LOS: 3 days

## 2014-11-07 NOTE — Consult Note (Addendum)
Reason for Consult:metastatic brain tumors, primary lung ca Referring Physician: Deborha [name] is an 74 y.o. female.  HPI: recently diagnosed with primary lung ca via biopsy left lung mass. Admitted to [facility] long on 3/15 for lethargy. Head CT, and subsequent cerebral MRI revealed 4 brain lesions, one very large in the right temporal lobe, large lesion right occipital, and left temporal lobes. On lesion left cerebellar hemisphere.  Past Medical History  Diagnosis Date  . COPD (chronic obstructive pulmonary disease)   . Stroke 11/03/14    per family  . Pneumonia Feb 2016  . Headache   . Cancer Mar. 2016    primary lung with mets to brain    Past Surgical History  Procedure Laterality Date  . No past surgeries      Family History  Problem Relation Age of Onset  . Stroke Father   . Colon cancer Daughter   . Melanoma Mother   . Alzheimer's disease Father     Social History:  reports that she has been smoking.  She has never used smokeless tobacco. She reports that she does not drink alcohol or use illicit drugs.  Allergies:  Allergies  Allergen Reactions  . Bupropion Hcl     REACTION: hives  . Codeine     REACTION: nausea but can tolerate with food    Medications: I have reviewed the patient's current medications.  No results found for this or any previous visit (from the past 48 hour(s)).  Mr [name] [name] Wo Contrast  11/06/2014   CLINICAL DATA:  Lung cancer and brain metastases.  EXAM: MRI HEAD WITHOUT AND WITH CONTRAST  TECHNIQUE: Multiplanar, multiecho pulse sequences of the brain and surrounding structures were obtained without and with intravenous contrast.  CONTRAST:  49mL MULTIHANCE GADOBENATE DIMEGLUMINE 529 MG/ML IV SOLN  COMPARISON:  Head CT 11/04/2014  FINDINGS: Images are mildly degraded by motion artifact. There is no evidence of acute infarct or extra-axial fluid collection.  Hemorrhagic right temporal lobe mass measures 5.4 x 4.6 x 4.3 cm with extensive  surrounding vasogenic edema. 10 mm of leftward midline shift is unchanged. Hemorrhagic right occipital lobe mass measures 3.7 x 2.5 cm with mild surrounding edema. Peripherally enhancing left temporal lobe mass measures 2.0 x 1.6 cm with mild surrounding edema. There is a 9 mm solidly enhancing mass in the left cerebellum with mild surrounding edema. There is right uncal herniation. There is no evidence of hydrocephalus.  Orbits are unremarkable. Paranasal sinuses and mastoid air cells are clear. Major intracranial vascular flow voids are preserved.  IMPRESSION: Four brain metastases, the largest being a 5.4 cm hemorrhagic right temporal lobe metastasis with extensive surrounding edema and 10 mm of unchanged midline shift.   Electronically Signed   By: [name] [name]   On: 11/06/2014 19:50    Review of Systems  Constitutional: Positive for weight loss and malaise/fatigue.  HENT: Negative.   Eyes: Negative.   Respiratory: Positive for cough and shortness of breath.   Gastrointestinal: Negative.   Genitourinary: Negative.   Musculoskeletal: Negative.   Skin: Negative.   Neurological:       Lethargy  Endo/Heme/Allergies:       Hypothyroidism   Blood pressure 135/57, pulse 65, temperature 98.3 F (36.8 C), temperature source Axillary, resp. rate 16, height 5\' 6"  (1.676 m), weight 55.339 kg (122 lb), SpO2 95 %. Physical Exam  Constitutional: She is oriented to person, place, and time. She appears lethargic. She appears distressed.  Cachetic, frail  appearance  HENT:  Head: Normocephalic and atraumatic.  Eyes: EOM are normal. Pupils are equal, round, and reactive to light.  Neck: Normal range of motion. Neck supple.  Cardiovascular: Normal rate and regular rhythm.   GI: Soft. Bowel sounds are normal.  Neurological: She is oriented to person, place, and time. She has normal strength. She appears lethargic. No cranial nerve deficit or sensory deficit. She exhibits normal muscle tone. She displays no  Babinski's sign on the right side. She displays no Babinski's sign on the left side.  Patient in bed, did not assess gait, lower extremity coordination. Normal coordination in upper extremities Strength normal on manual exam.  Skin: Skin is warm and dry.  Psychiatric: Her behavior is normal. Thought content normal.    Assessment/Plan: 74 yo with newly diagnosed lung CA, debilitated, cachetic, with 4 metastatic brain tumors. I do not believe she is a very good surgical candidate given her current physical condition. I do not strongly believe an operation to remove the right temporal lesion would provide meaningful prolongation of her life. This is with full knowledge that the right temporal lesion if not treated with surgery will most likely be the proximate cause of her death. She has a strong desire to return home, and I do not believe surgery will provide for her return to home sooner. I have told the family that if they insist I would do the surgery, but I must admit that I do not think surgery is a good idea. If you have any questions please call.  [name] [name] L 11/07/2014, 4:28 PM

## 2014-11-07 NOTE — Progress Notes (Signed)
Thank you for consult on [name] [name]. Patient was admitted with PNA as well as gait disorder due to multiple brain mets with extensive edema surrounding right temporal lobe mass. Work up underway to help with decision on further treatment. Will follow along to help decided on appropriate rehab venue as medical team decides on treatment options.

## 2014-11-07 NOTE — Progress Notes (Addendum)
Physical Therapy Treatment Patient Details Name: [name] [name] MRN: [id number] DOB: 1941/06/18 Today's Date: 11/07/2014    History of Present Illness 74 yo female admitted with fall at home, lung cancer with brain mets. Hx of  COPD, lung cancer diagnosed 10/2014, CVA.     PT Comments    Increased assistance needed this session. Noted bil LE buckling during session-L worse than R. Pt appears and reports feeling weaker,tired as well. D/C plan has been changed to CIR vs SNF-depending on medical course and pt progress. Pt was able to ambulate on last session. Could not safely attempt ambulation on today. LOB during session requiring Mod assist to prevent fall.   Follow Up Recommendations  CIR;SNF;Supervision/Assistance - 24 hour (depending on medical course/progress)     Equipment Recommendations   (continuing to assess)    Recommendations for Other Services Rehab consult     Precautions / Restrictions Precautions Precautions: Fall Restrictions Weight Bearing Restrictions: No    Mobility  Bed Mobility Overal bed mobility: Needs Assistance Bed Mobility: Supine to Sit;Sit to Supine     Supine to sit: Min assist Sit to supine: Min guard   General bed mobility comments: close guard for safety. Increased time and cueing  Transfers Overall transfer level: Needs assistance Equipment used: Rolling walker (2 wheeled) Transfers: Sit to/from Stand Sit to Stand: Mod assist         General transfer comment: Assist to rise, stabilize, control descent. Multimodal cues for safety, technique, hand placement. Leaning to L side-pt unaware.   Ambulation/Gait     Assistive device: Rolling walker (2 wheeled)       General Gait Details: NT-pt unable-too fatigued/weak on today. So, only pre-gait tasks at EOB in standing: heel raises, marching, side steps with RW. LOB to L side while side-stepping-Mod Assist to prevent fall   Stairs            Wheelchair Mobility    Modified  Rankin (Stroke Patients Only)       Balance   Sitting-balance support: Bilateral upper extremity supported;Feet supported Sitting balance-Leahy Scale: Fair     Standing balance support: Bilateral upper extremity supported;During functional activity Standing balance-Leahy Scale: Poor Standing balance comment: Leaning to L side in static standing. LOB to L side during side stepping.                     Cognition Arousal/Alertness: Lethargic Behavior During Therapy: Flat affect   Area of Impairment: Orientation;Memory;Following commands;Safety/judgement;Problem solving Orientation Level: Situation;Place   Memory: Decreased short-term memory Following Commands: Follows one step commands with increased time Safety/Judgement: Decreased awareness of deficits   Problem Solving: Requires tactile cues;Requires verbal cues;Slow processing      Exercises General Exercises - Lower Extremity Hip Flexion/Marching: AROM;5 reps;Standing;Both Heel Raises: AROM;Both;5 reps;Standing    General Comments        Pertinent Vitals/Pain Pain Assessment:  (pt did not report any pain)    Home Living                      Prior Function            PT Goals (current goals can now be found in the care plan section) Progress towards PT goals: Progressing toward goals (weaker on today, more lethargic as well. Will continue to asess)    Frequency  Min 3X/week    PT Plan Discharge plan needs to be updated    Co-evaluation  End of Session Equipment Utilized During Treatment: Gait belt Activity Tolerance: Patient limited by fatigue Patient left: in bed;with call bell/phone within reach;with family/visitor present     Time: 4097-3532 PT Time Calculation (min) (ACUTE ONLY): 22 min  Charges:  $Therapeutic Activity: 8-22 mins                    G Codes:      Weston Anna, MPT Pager: [phone]

## 2014-11-07 NOTE — Progress Notes (Addendum)
Rehab Admissions Coordinator Note:  Patient was screened by Cleatrice Burke for appropriateness for an Inpatient Acute Rehab Consult per PT recommendation.  At this time, we are recommending Inpatient Rehab consult once medical plan of care is outlined. Please place order when appropriate. Will also need OT eval . Please order.  Cleatrice Burke 11/07/2014, 10:40 AM  I can be reached at [phone].

## 2014-11-07 NOTE — Progress Notes (Signed)
Addendum: Spoke with neurosurgeon and radiation oncologist.  I summarized the plan for the patient and family: 1) Pain management for headache 2) Continue dexamethasone for the weekend 3) No surgery due to high risks 4) Plan for whole brain radiation therapy to start next week 5) Dr. Cyndy Freeze has consulted palliative care  I will return next week to check on the patient.

## 2014-11-07 NOTE — Progress Notes (Signed)
[name] [name]   DOB:Oct 06, 1940   ZO#:[id number]   JWJ#:[id number]  No care team member to display  I have seen the patient, examined her and edited the notes as follows  Subjective: Patient seen and examined. Afebrile. She reports being very tired this morning. Denies fevers, chills, night sweats, vision changes, or mucositis. Denies any respiratory complaints. Denies any chest pain or palpitations. Denies lower extremity swelling. Denies nausea, heartburn or change in bowel habits. Appetite is normal. Denies any dysuria. Denies abnormal skin rashes, or neuropathy. Denies any bleeding issues such as epistaxis, hematemesis, hematuria or hematochezia.  Headaches are worse than prior day. Has not been ambulating   Scheduled Meds: . dexamethasone  10 mg Intravenous 4 times per day  . feeding supplement (RESOURCE BREEZE)  1 Container Oral BID BM  . levothyroxine  25 mcg Oral QAC breakfast  . multivitamin with minerals  1 tablet Oral Daily  . pantoprazole  40 mg Oral Q1200  . piperacillin-tazobactam  3.375 g Intravenous 3 times per day  . potassium chloride  40 mEq Oral Once  . sodium chloride  3 mL Intravenous Q12H  . temazepam  7.5 mg Oral QHS   Continuous Infusions:   PRN Meds:acetaminophen **OR** acetaminophen, alum & mag hydroxide-simeth, guaiFENesin-dextromethorphan, HYDROcodone-acetaminophen, ipratropium-albuterol, LORazepam, morphine injection, ondansetron **OR** ondansetron (ZOFRAN) IV, polyethylene glycol, zolpidem   Objective:  Filed Vitals:   11/07/14 0647  BP: 134/56  Pulse: 55  Temp: 97.5 F (36.4 C)  Resp: 18      Intake/Output Summary (Last 24 hours) at 11/07/14 0713 Last data filed at 11/07/14 0553  Gross per 24 hour  Intake    150 ml  Output      0 ml  Net    150 ml   GENERAL: alert, no distress and flat affect SKIN: skin color, texture, turgor are normal, minimal echymmoses at the venipuncture sites. No significant lesions EYES: normal, conjunctiva are pink and  non-injected, sclera clear OROPHARYNX:no exudate, no erythema and lips, buccal mucosa, and tongue normal  NECK: supple, thyroid normal size, non-tender, without nodularity LYMPH:  no palpable lymphadenopathy in the cervical, axillary or inguinal LUNGS: clear to auscultation and percussion with normal breathing effort HEART: regular rate & rhythm and no murmurs and no lower extremity edema ABDOMEN: soft, non-tender and normal bowel sounds Musculoskeletal:no cyanosis of digits and no clubbing  PSYCH: alert & oriented x 3 with fluent speech NEURO: no focal motor/sensory deficits  Labs:   Recent Labs Lab 11/04/14 2000 11/05/14 0504  WBC 27.5* 25.6*  HGB 10.9* 10.4*  HCT 34.4* 32.7*  PLT 322 327  MCV 75.9* 76.2*  MCH 24.1* 24.2*  MCHC 31.7 31.8  RDW 16.4* 16.6*  LYMPHSABS 0.3*  --   MONOABS 0.3  --   EOSABS 0.0  --   BASOSABS 0.0  --    Chemistries:   Recent Labs Lab 11/04/14 2000 11/05/14 0504  NA 135 137  K 3.6 3.2*  CL 94* 98  CO2 32 30  GLUCOSE 207* 163*  BUN 15 17  CREATININE 0.62 0.51  CALCIUM 8.2* 8.4  MG 2.1  --   AST 8  --   ALT 11  --   ALKPHOS 80  --   BILITOT 0.8  --    GFR Estimated Creatinine Clearance: 54.7 mL/min (by C-G formula based on Cr of 0.51).  Liver Function Tests:  Recent Labs Lab 11/04/14 2000  AST 8  ALT 11  ALKPHOS 80  BILITOT 0.8  PROT  6.1  ALBUMIN 2.7*   Imaging Studies: I have reviewed the scans Mr Kizzie Fantasia Contrast  11/06/2014   CLINICAL DATA:  Lung cancer and brain metastases.  EXAM: MRI HEAD WITHOUT AND WITH CONTRAST  TECHNIQUE: Multiplanar, multiecho pulse sequences of the brain and surrounding structures were obtained without and with intravenous contrast.  CONTRAST:  60mL MULTIHANCE GADOBENATE DIMEGLUMINE 529 MG/ML IV SOLN  COMPARISON:  Head CT 11/04/2014  FINDINGS: Images are mildly degraded by motion artifact. There is no evidence of acute infarct or extra-axial fluid collection.  Hemorrhagic right temporal lobe  mass measures 5.4 x 4.6 x 4.3 cm with extensive surrounding vasogenic edema. 10 mm of leftward midline shift is unchanged. Hemorrhagic right occipital lobe mass measures 3.7 x 2.5 cm with mild surrounding edema. Peripherally enhancing left temporal lobe mass measures 2.0 x 1.6 cm with mild surrounding edema. There is a 9 mm solidly enhancing mass in the left cerebellum with mild surrounding edema. There is right uncal herniation. There is no evidence of hydrocephalus.  Orbits are unremarkable. Paranasal sinuses and mastoid air cells are clear. Major intracranial vascular flow voids are preserved.  IMPRESSION: Four brain metastases, the largest being a 5.4 cm hemorrhagic right temporal lobe metastasis with extensive surrounding edema and 10 mm of unchanged midline shift.   Electronically Signed   By: Logan Bores   On: 11/06/2014 19:50   Assessment/Plan: 74 y.o.  Metastatic Lung Cancer to the brain Patient was admitted to hospital after found to have metastatic brain lesions She carries a recent diagnosis of Lung Cancer, per biopsy at [facility] on 10/31/14 She is on Decadron and Keppra for seizure prophylaxis, please continue She may benefit from PET/CT outpatient to further evaluate for metastatic disease MRI brain on 3/17 showed Four brain metastases, the largest being a 5.4 cm hemorrhagic right temporal lobe metastasis with extensive surrounding edema and 10 mm of unchanged midline shift. I called Neurosurgery to discuss metastatic brain lesions, the possibility of resection and discussed with Dr. Christella Noa  She may need to be transferred to [facility] if surgery is planned. Still awaiting records from [facility] we need this records for further evaluation. 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 Please call consult service over the weekend if issues arise. Otherwise, I will return to see her on Monday. **Disclaimer: This note was dictated with voice recognition software. Similar sounding words can inadvertently be transcribed and this note may contain transcription errors which may not have been corrected upon publication of note.Sharene Butters E, PA-C 11/07/2014  7:13 AM Harland Aguiniga, MD 11/07/2014

## 2014-11-07 NOTE — Progress Notes (Signed)
I have spoken to neurosurgery and medical oncology regarding the patient. Surgery is not felt to be a good option for the patient and her current health status. I have therefore discussed proceeding with whole brain radiation treatment early next week with the patient. I will plan for the patient to undergo a simulation on Monday and we will try to begin her treatment Monday afternoon. I anticipate treating the patient with 2 weeks of whole brain radiation treatment.  I discussed this with the patient today in the presence of her family. All of their questions were answered. We discussed the rationale for treatment as well as possible side effects and risks.  -Continue Decadron -Simulation Monday morning for treatment planning to begin -We will also plan to begin her first fraction of radiation treatment Monday afternoon. -As she proceeds with whole brain radiation treatment, we will begin tapering her Decadron dose.

## 2014-11-08 DIAGNOSIS — J449 Chronic obstructive pulmonary disease, unspecified: Secondary | ICD-10-CM

## 2014-11-08 DIAGNOSIS — F17200 Nicotine dependence, unspecified, uncomplicated: Secondary | ICD-10-CM

## 2014-11-08 NOTE — Evaluation (Signed)
Occupational Therapy Evaluation Patient Details Name: [name] [name] MRN: [id number] DOB: 1941/03/01 Today's Date: 11/08/2014    History of Present Illness 74 yo female admitted with fall at home, lung cancer with brain mets. Hx of  COPD, lung cancer diagnosed 10/2014, CVA.    Clinical Impression   Pt admitted with fall at home. Pt currently with functional limitations due to the deficits listed below (see OT Problem List).  Pt will benefit from skilled OT to increase their safety and independence with ADL and functional mobility for ADL to facilitate discharge to venue listed below.      Follow Up Recommendations  Home health OT    Equipment Recommendations  None recommended by OT    Recommendations for Other Services       Precautions / Restrictions Precautions Precautions: Fall Restrictions Weight Bearing Restrictions: No      Mobility Bed Mobility Overal bed mobility: Needs Assistance Bed Mobility: Supine to Sit;Sit to Supine     Supine to sit: Min assist Sit to supine: Min guard   General bed mobility comments: close guard for safety. Increased time and cueing  Transfers Overall transfer level: Needs assistance Equipment used: Rolling walker (2 wheeled)   Sit to Stand: Mod assist                   ADL Overall ADL's : Needs assistance/impaired Eating/Feeding: Set up;Sitting   Grooming: Sitting;Set up;Brushing hair   Upper Body Bathing: Min guard;Sitting   Lower Body Bathing: Moderate assistance;Sit to/from stand   Upper Body Dressing : Set up;Sitting   Lower Body Dressing: Sit to/from stand;Moderate assistance                 General ADL Comments: needed max encouragement!               Pertinent Vitals/Pain Pain Score: 5  Pain Location: head ache Pain Descriptors / Indicators: Pounding Pain Intervention(s): Limited activity within patient's tolerance;Monitored during session;Repositioned     Hand Dominance      Extremity/Trunk Assessment Upper Extremity Assessment Upper Extremity Assessment: Generalized weakness           Communication Communication Communication: No difficulties   Cognition Arousal/Alertness: Awake/alert Behavior During Therapy: WFL for tasks assessed/performed Overall Cognitive Status: Within Functional Limits for tasks assessed                     General Comments   pt sat EOB for 15 minutes. SPoke with pt regarding importance of rest and ability to rest. Pt had 6 family members in room and lights bright. Pts main complaint was a head ache.  Explained cutting down lights may help with this and decrease HA            Home Living Family/patient expects to be discharged to:: Private residence Living Arrangements: Children;Other relatives (granddaughter) Available Help at Discharge: Family Type of Home: Apartment (townhome) Home Access: Level entry     Home Layout: Two level;Bed/bath upstairs     Bathroom Shower/Tub: Teacher, early years/pre: Standard     Home Equipment: Environmental consultant - 2 wheels;Cane - single point          Prior Functioning/Environment Level of Independence: Independent             OT Diagnosis: Generalized weakness;Acute pain   OT Problem List: Decreased strength;Decreased activity tolerance;Impaired balance (sitting and/or standing)   OT Treatment/Interventions: Self-care/ADL training;DME and/or AE instruction;Patient/family education    OT  Goals(Current goals can be found in the care plan section) Acute Rehab OT Goals Patient Stated Goal: none stated OT Goal Formulation: With patient Time For Goal Achievement: 11/22/14  OT Frequency: Min 2X/week   Barriers to D/C:            Co-evaluation              End of Session Nurse Communication: Mobility status  Activity Tolerance: Patient tolerated treatment well Patient left: in bed;with call bell/phone within reach;with family/visitor present   Time:  1350-1430 OT Time Calculation (min): 40 min Charges:  OT General Charges $OT Visit: 1 Procedure OT Evaluation $Initial OT Evaluation Tier I: 1 Procedure OT Treatments $Self Care/Home Management : 23-37 mins G-Codes:    Payton Mccallum D Nov 30, 2014, 2:33 PM

## 2014-11-08 NOTE — Progress Notes (Signed)
IV that was in the right forearm was charted that it was removed. It was present on assessment. Flushed IV and it was working, IV pain medication administered through the IV in the right forearm. Will get pt new IV and removed IV that was already charted removed.

## 2014-11-08 NOTE — Clinical Social Work Note (Signed)
CSW reviewed consult for advance directives and met with pt at bedside  Pt wanted CSW to meet with her children to discuss services for her  CSW met with pt's daughter Venita Sheffield ([place]), pt's mother, and pt's daughter Mariann Laster to provide information about possible services for pt at discharge  Pt's family confused about pt's medical services stating that there are numerous doctors involved and that has caused them some confusion.  CSW provided information about [facility] and explained that pt had been approved for this service at pt discharge   CSW prompted family to talk about their thoughts feelings and needs and provided active and supportive listening related to pt's diagnoses   CSW provided education to pt's family on the different stages of grief and loss  CSW provided family advance directive booklet and asked family to call with any questions they may have  CSW met with MD and asked him to meet with pt's family to help with their confusion  MD stated that he had just met with family at bedside but agreed to call pt's family to further explain and help support the pt  CSW followed up with pt's family and they acknowledged that they had spoken to pt's MD  .Dede Query, [place] Worker - Weekend Coverage cell #: [phone]

## 2014-11-08 NOTE — Progress Notes (Signed)
Family keeps insisting on me bathing patient and changing gown, but patient keeps refusing.

## 2014-11-08 NOTE — Progress Notes (Signed)
[facility] PROGRESS NOTE  Sharonica Kraszewski PHX:[id number] DOB: 05/24/1941 DOA: 11/04/2014 PCP: No primary care provider on file.  Assessment/Plan:    Pneumonia due to anaerobes - Patient is currently on augmentin  Active Problems:   Lung cancer - Oncologist consulted - supportive therapy    Brain metastases - Continue decadron - Radiologist/oncologist consulted - MRI obtained - Pt on antiepileptic medication.  - Awaiting further decision making, but plan is for radiation therapy to commence on Monday. Discussed with family. Family is concerned about disposition planning. At this juncture deciding whether or not patient will be considered for inpatient rehabilitation. This was also discussed with family.  Hypokalemia - replaced orally - reassess next am.    COPD (chronic obstructive pulmonary disease) - Stable continue bronchodilators as needed     Leucocytosis - Patient is currently on Decadron and there is a report she may have received some prior to admission - reassess next am.    Hypothyroidism - Synthroid as patient had elevated TSH upon initial evaluation. - Will need repeat TSH levels in 4-6 weeks as outpatient    Protein-calorie malnutrition, severe - RD on board - Agree with liberalize his diet to regular diet  Code Status: full Family Communication: none at bedside Disposition Plan: Pending further recommendations from specialist involved   Consultants:  Oncology  Rad Onc  Antibiotics:  Zosyn  HPI/Subjective: Pt has no new complaints. No acute issues overnight.  Objective: Filed Vitals:   11/08/14 1434  BP: 135/62  Pulse: 63  Temp: 98.7 F (37.1 C)  Resp: 16    Intake/Output Summary (Last 24 hours) at 11/08/14 1620 Last data filed at 11/08/14 1330  Gross per 24 hour  Intake    480 ml  Output    675 ml  Net   -195 ml   Filed Weights   11/04/14 1700  Weight: 55.339 kg (122 lb)    Exam:   General:  Pt in nad, alert nad  awake  Cardiovascular: RRR, no mrg  Respiratory: Clear to auscultation bilaterally, no wheezes, equal chest rise  Abdomen: Soft, nondistended  Musculoskeletal: No cyanosis or clubbing    Data Reviewed: Basic Metabolic Panel:  Recent Labs Lab 11/04/14 2000 11/05/14 0504  NA 135 137  K 3.6 3.2*  CL 94* 98  CO2 32 30  GLUCOSE 207* 163*  BUN 15 17  CREATININE 0.62 0.51  CALCIUM 8.2* 8.4  MG 2.1  --   PHOS 3.0  --    Liver Function Tests:  Recent Labs Lab 11/04/14 2000  AST 8  ALT 11  ALKPHOS 80  BILITOT 0.8  PROT 6.1  ALBUMIN 2.7*   No results for input(s): LIPASE, AMYLASE in the last 168 hours. No results for input(s): AMMONIA in the last 168 hours. CBC:  Recent Labs Lab 11/04/14 2000 11/05/14 0504  WBC 27.5* 25.6*  NEUTROABS 26.9*  --   HGB 10.9* 10.4*  HCT 34.4* 32.7*  MCV 75.9* 76.2*  PLT 322 327   Cardiac Enzymes: No results for input(s): CKTOTAL, CKMB, CKMBINDEX, TROPONINI in the last 168 hours. BNP (last 3 results) No results for input(s): BNP in the last 8760 hours.  ProBNP (last 3 results) No results for input(s): PROBNP in the last 8760 hours.  CBG: No results for input(s): GLUCAP in the last 168 hours.  Recent Results (from the past 240 hour(s))  Culture, Urine     Status: None   Collection Time: 11/05/14  5:25 AM  Result Value Ref  Range Status   Specimen Description URINE, CLEAN CATCH  Final   Special Requests NONE  Final   Colony Count NO GROWTH Performed at [facility]   Final   Culture NO GROWTH Performed at [facility]   Final   Report Status 2014/11/10 FINAL  Final     Studies: Mr Kizzie Fantasia Contrast  11/10/2014   CLINICAL DATA:  Lung cancer and brain metastases.  EXAM: MRI HEAD WITHOUT AND WITH CONTRAST  TECHNIQUE: Multiplanar, multiecho pulse sequences of the brain and surrounding structures were obtained without and with intravenous contrast.  CONTRAST:  34mL MULTIHANCE GADOBENATE DIMEGLUMINE 529  MG/ML IV SOLN  COMPARISON:  Head CT 11/04/2014  FINDINGS: Images are mildly degraded by motion artifact. There is no evidence of acute infarct or extra-axial fluid collection.  Hemorrhagic right temporal lobe mass measures 5.4 x 4.6 x 4.3 cm with extensive surrounding vasogenic edema. 10 mm of leftward midline shift is unchanged. Hemorrhagic right occipital lobe mass measures 3.7 x 2.5 cm with mild surrounding edema. Peripherally enhancing left temporal lobe mass measures 2.0 x 1.6 cm with mild surrounding edema. There is a 9 mm solidly enhancing mass in the left cerebellum with mild surrounding edema. There is right uncal herniation. There is no evidence of hydrocephalus.  Orbits are unremarkable. Paranasal sinuses and mastoid air cells are clear. Major intracranial vascular flow voids are preserved.  IMPRESSION: Four brain metastases, the largest being a 5.4 cm hemorrhagic right temporal lobe metastasis with extensive surrounding edema and 10 mm of unchanged midline shift.   Electronically Signed   By: Logan Bores   On: 10-Nov-2014 19:50    Scheduled Meds: . amoxicillin-clavulanate  1 tablet Oral BID  . dexamethasone  10 mg Intravenous 4 times per day  . feeding supplement (RESOURCE BREEZE)  1 Container Oral BID BM  . levothyroxine  25 mcg Oral QAC breakfast  . multivitamin with minerals  1 tablet Oral Daily  . pantoprazole  40 mg Oral Q1200  . sodium chloride  3 mL Intravenous Q12H  . temazepam  7.5 mg Oral QHS   Continuous Infusions:    Time spent: > 35 minutes    Velvet Bathe  [facility] Pager [phone]  If 7PM-7AM, please contact night-coverage at [email or web address], password [facility] 11/08/2014, 4:20 PM  LOS: 4 days

## 2014-11-09 LAB — BASIC METABOLIC PANEL
ANION GAP: 11 (ref 5–15)
BUN: 18 mg/dL (ref 6–23)
CHLORIDE: 95 mmol/L — AB (ref 96–112)
CO2: 34 mmol/L — AB (ref 19–32)
Calcium: 8.6 mg/dL (ref 8.4–10.5)
Creatinine, Ser: 0.53 mg/dL (ref 0.50–1.10)
GFR calc Af Amer: >90 mL/min (ref >90)
GFR calc non Af Amer: >90 mL/min (ref >90)
GLUCOSE: 158 mg/dL — AB (ref 70–99)
Potassium: 3.8 mmol/L (ref 3.5–5.1)
Sodium: 140 mmol/L (ref 135–145)

## 2014-11-09 LAB — CBC
HCT: 32.9 % — ABNORMAL LOW (ref 36.0–46.0)
HEMOGLOBIN: 10.2 g/dL — AB (ref 12.0–15.0)
MCH: 23.8 pg — ABNORMAL LOW (ref 26.0–34.0)
MCHC: 31 g/dL (ref 30.0–36.0)
MCV: 76.9 fL — AB (ref 78.0–100.0)
Platelets: 327 10*3/uL (ref 150–400)
RBC: 4.28 MIL/uL (ref 3.87–5.11)
RDW: 17 % — ABNORMAL HIGH (ref 11.5–15.5)
WBC: 22.8 10*3/uL — ABNORMAL HIGH (ref 4.0–10.5)

## 2014-11-09 NOTE — Progress Notes (Signed)
[facility] PROGRESS NOTE  Toriann Spadoni UJW:[id number] DOB: 1940-12-22 DOA: 11/04/2014 PCP: No primary care provider on file.  Brief narrative: 74 year old female with COPD and tobacco dependency just diagnosed of lung CA(lung biopsy done by [facility] in [facility] on 10/31/14, and results confirming malignancy shared with patient and family today), who comes in as a referral from [facility] regional ED to be followed by Dr. Alvy Bimler here, after she was found on the floor by family members this morning. Apparently, she could not get up on her on and this is likely a result of metastatic brain lesions. Patient is currently undergoing further evaluation and recommendations by specialist involved.  Assessment/Plan:    Pneumonia due to anaerobes - Patient is currently on augmentin and will plan on discontinuing after tomorrow's 11/10/2014 dose.  Active Problems:   Lung cancer - Oncologist consulted - supportive therapy    Brain metastases - Continue decadron - Radiologist/oncologist consulted - MRI obtained - Pt on antiepileptic medication.  - Awaiting further decision making, but plan is for radiation therapy to commence on Monday. Discussed with family. Patient is currently being considered for inpatient rehabilitation  Hypokalemia - replaced orally - reassess next am.    COPD (chronic obstructive pulmonary disease) - Stable continue bronchodilators as needed     Leucocytosis - Patient is currently on Decadron and there is a report she may have received some prior to admission - reassess next am.    Hypothyroidism - Synthroid as patient had elevated TSH upon initial evaluation. - Will need repeat TSH levels in 4-6 weeks as outpatient    Protein-calorie malnutrition, severe - RD on board - Agree with liberalize his diet to regular diet  Code Status: full Family Communication: none at bedside Disposition Plan: Pending further recommendations from specialist involved, also  awaiting disposition planning   Consultants:  Oncology  Rad Onc  Antibiotics:  Zosyn  HPI/Subjective: Pt has no new complaints. No acute issues overnight.  Objective: Filed Vitals:   11/09/14 0628  BP: 145/62  Pulse: 60  Temp: 97.9 F (36.6 C)  Resp: 16    Intake/Output Summary (Last 24 hours) at 11/09/14 1250 Last data filed at 11/09/14 0700  Gross per 24 hour  Intake    963 ml  Output    250 ml  Net    713 ml   Filed Weights   11/04/14 1700  Weight: 55.339 kg (122 lb)    Exam:   General:  Pt in nad, alert nad awake  Cardiovascular: RRR, no mrg  Respiratory: Clear to auscultation bilaterally, no wheezes, equal chest rise  Abdomen: Soft, nondistended  Musculoskeletal: No cyanosis or clubbing    Data Reviewed: Basic Metabolic Panel:  Recent Labs Lab 11/04/14 2000 11/05/14 0504 11/09/14 0541  NA 135 137 140  K 3.6 3.2* 3.8  CL 94* 98 95*  CO2 32 30 34*  GLUCOSE 207* 163* 158*  BUN 15 17 18   CREATININE 0.62 0.51 0.53  CALCIUM 8.2* 8.4 8.6  MG 2.1  --   --   PHOS 3.0  --   --    Liver Function Tests:  Recent Labs Lab 11/04/14 2000  AST 8  ALT 11  ALKPHOS 80  BILITOT 0.8  PROT 6.1  ALBUMIN 2.7*   No results for input(s): LIPASE, AMYLASE in the last 168 hours. No results for input(s): AMMONIA in the last 168 hours. CBC:  Recent Labs Lab 11/04/14 2000 11/05/14 0504 11/09/14 0541  WBC 27.5* 25.6* 22.8*  NEUTROABS 26.9*  --   --   HGB 10.9* 10.4* 10.2*  HCT 34.4* 32.7* 32.9*  MCV 75.9* 76.2* 76.9*  PLT 322 327 327   Cardiac Enzymes: No results for input(s): CKTOTAL, CKMB, CKMBINDEX, TROPONINI in the last 168 hours. BNP (last 3 results) No results for input(s): BNP in the last 8760 hours.  ProBNP (last 3 results) No results for input(s): PROBNP in the last 8760 hours.  CBG: No results for input(s): GLUCAP in the last 168 hours.  Recent Results (from the past 240 hour(s))  Culture, Urine     Status: None   Collection  Time: 11/05/14  5:25 AM  Result Value Ref Range Status   Specimen Description URINE, CLEAN CATCH  Final   Special Requests NONE  Final   Colony Count NO GROWTH Performed at [facility]   Final   Culture NO GROWTH Performed at [facility]   Final   Report Status 11/06/2014 FINAL  Final     Studies: No results found.  Scheduled Meds: . amoxicillin-clavulanate  1 tablet Oral BID  . dexamethasone  10 mg Intravenous 4 times per day  . feeding supplement (RESOURCE BREEZE)  1 Container Oral BID BM  . levothyroxine  25 mcg Oral QAC breakfast  . multivitamin with minerals  1 tablet Oral Daily  . pantoprazole  40 mg Oral Q1200  . sodium chloride  3 mL Intravenous Q12H  . temazepam  7.5 mg Oral QHS   Continuous Infusions:    Time spent: > 35 minutes    Velvet Bathe  [facility] Pager [phone]  If 7PM-7AM, please contact night-coverage at [email or web address], password [facility] 11/09/2014, 12:50 PM  LOS: 5 days

## 2014-11-10 ENCOUNTER — Telehealth: Payer: Self-pay | Admitting: *Deleted

## 2014-11-10 ENCOUNTER — Ambulatory Visit
Admit: 2014-11-10 | Discharge: 2014-11-10 | Disposition: A | Discharge status: Home / Self Care | Payer: Commercial Managed Care - PPO | Attending: Radiation Oncology | Admitting: Radiation Oncology

## 2014-11-10 ENCOUNTER — Encounter: Payer: Self-pay | Admitting: Radiation Oncology

## 2014-11-10 ENCOUNTER — Inpatient Hospital Stay: Admit: 2014-11-10 | Payer: Self-pay | Admitting: Radiation Oncology

## 2014-11-10 DIAGNOSIS — C7931 Secondary malignant neoplasm of brain: Secondary | ICD-10-CM

## 2014-11-10 NOTE — Progress Notes (Signed)
[facility] PROGRESS NOTE  [name] [name] TMH:[id number] DOB: 1940/10/13 DOA: 11/04/2014 PCP: No primary care provider on file.  Brief narrative: 74 year old female with COPD and tobacco dependency just diagnosed of lung CA(lung biopsy done by [facility] in [facility] on 10/31/14, and results confirming malignancy shared with patient and family today), who comes in as a referral from [facility] regional ED to be followed by Dr. Alvy Bimler here, after she was found on the floor by family members this morning. Apparently, she could not get up on her on and this is likely a result of metastatic brain lesions. Patient is currently undergoing further evaluation and recommendations by specialist involved.  Assessment/Plan:    Pneumonia due to anaerobes - Patient is currently on augmentin and will plan on discontinuing after tomorrow's 11/10/2014 dose.  Altered mental status - Patient recently had IV opioid medications, lorazepam, Ambien. I suspect alteration in mental status most likely secondary to increase sedating medications on board. We'll hold sedating medications until patient's mental status is cleared.  Active Problems:   Lung cancer - Oncologist consulted - supportive therapy    Brain metastases - Continue decadron - Radiologist/oncologist consulted - MRI obtained - Pt on antiepileptic medication.  - Awaiting further decision making, but plan is for radiation therapy to commence today- Monday. Discussed with family. Patient is currently being considered for inpatient rehabilitation  Hypokalemia - replaced orally - reassess next am.    COPD (chronic obstructive pulmonary disease) - Stable continue bronchodilators as needed     Leucocytosis - Most likely secondary to Decadron administration for brain masses    Hypothyroidism - Synthroid as patient had elevated TSH upon initial evaluation. - Will need repeat TSH levels in 4-6 weeks as outpatient    Protein-calorie malnutrition,  severe - RD on board - Agree with liberalize his diet to regular diet  Code Status: full Family Communication: none at bedside Disposition Plan: Pending further recommendations from specialist involved, also awaiting disposition planning and acceptance to CIR   Consultants:  Oncology  Rad Onc  Antibiotics:  Zosyn  HPI/Subjective: Pt has no new complaints. No acute issues overnight.  Objective: Filed Vitals:   11/10/14 0550  BP: 162/63  Pulse: 58  Temp: 97.5 F (36.4 C)  Resp: 16    Intake/Output Summary (Last 24 hours) at 11/10/14 1159 Last data filed at 11/10/14 1015  Gross per 24 hour  Intake     60 ml  Output   1000 ml  Net   -940 ml   Filed Weights   11/04/14 1700  Weight: 55.339 kg (122 lb)    Exam:   General:  Pt in nad, alert nad awake  Cardiovascular: RRR, no mrg  Respiratory: Clear to auscultation bilaterally, no wheezes, equal chest rise  Abdomen: Soft, nondistended  Musculoskeletal: No cyanosis or clubbing    Data Reviewed: Basic Metabolic Panel:  Recent Labs Lab 11/04/14 2000 11/05/14 0504 11/09/14 0541  NA 135 137 140  K 3.6 3.2* 3.8  CL 94* 98 95*  CO2 32 30 34*  GLUCOSE 207* 163* 158*  BUN 15 17 18   CREATININE 0.62 0.51 0.53  CALCIUM 8.2* 8.4 8.6  MG 2.1  --   --   PHOS 3.0  --   --    Liver Function Tests:  Recent Labs Lab 11/04/14 2000  AST 8  ALT 11  ALKPHOS 80  BILITOT 0.8  PROT 6.1  ALBUMIN 2.7*   No results for input(s): LIPASE, AMYLASE in the last  168 hours. No results for input(s): AMMONIA in the last 168 hours. CBC:  Recent Labs Lab 11/04/14 2000 11/05/14 0504 11/09/14 0541  WBC 27.5* 25.6* 22.8*  NEUTROABS 26.9*  --   --   HGB 10.9* 10.4* 10.2*  HCT 34.4* 32.7* 32.9*  MCV 75.9* 76.2* 76.9*  PLT 322 327 327   Cardiac Enzymes: No results for input(s): CKTOTAL, CKMB, CKMBINDEX, TROPONINI in the last 168 hours. BNP (last 3 results) No results for input(s): BNP in the last 8760  hours.  ProBNP (last 3 results) No results for input(s): PROBNP in the last 8760 hours.  CBG: No results for input(s): GLUCAP in the last 168 hours.  Recent Results (from the past 240 hour(s))  Culture, Urine     Status: None   Collection Time: 11/05/14  5:25 AM  Result Value Ref Range Status   Specimen Description URINE, CLEAN CATCH  Final   Special Requests NONE  Final   Colony Count NO GROWTH Performed at [facility]   Final   Culture NO GROWTH Performed at [facility]   Final   Report Status 11/06/2014 FINAL  Final     Studies: No results found.  Scheduled Meds: . amoxicillin-clavulanate  1 tablet Oral BID  . dexamethasone  10 mg Intravenous 4 times per day  . feeding supplement (RESOURCE BREEZE)  1 Container Oral BID BM  . levothyroxine  25 mcg Oral QAC breakfast  . multivitamin with minerals  1 tablet Oral Daily  . pantoprazole  40 mg Oral Q1200  . sodium chloride  3 mL Intravenous Q12H  . temazepam  7.5 mg Oral QHS   Continuous Infusions:    Time spent: > 35 minutes    Velvet Bathe  [facility] Pager [phone]  If 7PM-7AM, please contact night-coverage at [email or web address], password [email or web address] 11/10/2014, 11:59 AM  LOS: 6 days

## 2014-11-10 NOTE — Progress Notes (Signed)
PT Cancellation Note  Patient Details Name: Olimpia Tinch MRN: [id number] DOB: 1941/01/03   Cancelled Treatment:     Too many sedation meds per MD.  Will re attempt another day as schedule permits.   Nathanial Rancher 11/10/2014, 3:26 PM

## 2014-11-10 NOTE — Progress Notes (Signed)
OT Cancellation Note  Patient Details Name: [name] [name] MRN: [id number] DOB: [birth year]   Cancelled Treatment:    Reason Eval/Treat Not Completed: Fatigue/lethargy limiting ability to participate Spoke with pts mom.  Will recheck on pt next day  [place], Thereasa Parkin 11/10/2014, 1:28 PM

## 2014-11-10 NOTE — Progress Notes (Signed)
Called Dimple Casey (patients mother) to see if family was available to sign consent for her radiation today. Radiation simulation rescheduled for 10am.

## 2014-11-10 NOTE — Progress Notes (Signed)
Patient having trouble swallowing. Drooling from both sides of mouth when drinking water. Disoriented to where she is this morning. Very weak. Pt has no appetite and has only has few sips of a breeze since yesterday. Spoke with oncology PA. Will page Dr. Wendee Beavers regarding change in mental status.

## 2014-11-10 NOTE — Progress Notes (Signed)
[name] [name]   DOB:06/20/1941   IH#:[id number]   LKJ#:[id number]  No care team member to display  I have seen the patient, examined her and edited the notes as follows  Subjective: Patient seen and examined. Afebrile. She is very weak, she appears more confused this morning, delayed responses. Headaches are worse than prior day. No respiratory or cardiac issues. Appetite decreased, and she has increased salivary secretions. Has not been ambulating. Nursing reports these changes began late on 3/20.   Scheduled Meds: . amoxicillin-clavulanate  1 tablet Oral BID  . dexamethasone  10 mg Intravenous 4 times per day  . feeding supplement (RESOURCE BREEZE)  1 Container Oral BID BM  . levothyroxine  25 mcg Oral QAC breakfast  . multivitamin with minerals  1 tablet Oral Daily  . pantoprazole  40 mg Oral Q1200  . sodium chloride  3 mL Intravenous Q12H  . temazepam  7.5 mg Oral QHS   Continuous Infusions:   PRN Meds:acetaminophen **OR** acetaminophen, alum & mag hydroxide-simeth, guaiFENesin-dextromethorphan, HYDROcodone-acetaminophen, ipratropium-albuterol, LORazepam, morphine injection, ondansetron **OR** ondansetron (ZOFRAN) IV, polyethylene glycol, zolpidem   Objective:  Filed Vitals:   11/10/14 0550  BP: 162/63  Pulse: 58  Temp: 97.5 F (36.4 C)  Resp: 16      Intake/Output Summary (Last 24 hours) at 11/10/14 0656 Last data filed at 11/10/14 0548  Gross per 24 hour  Intake    240 ml  Output    700 ml  Net   -460 ml   GENERAL: confused, no distress and flat affect, frail. SKIN: skin color, texture, turgor are normal, minimal echymmoses at the venipuncture sites. No significant lesions EYES: normal, conjunctiva are pink and non-injected, sclera clear OROPHARYNX:no exudate, no erythema and lips, buccal mucosa, and tongue normal  NECK: supple, thyroid normal size, non-tender, without nodularity LYMPH:  no palpable lymphadenopathy in the cervical, axillary or inguinal LUNGS:decreased  breath sounds at the bases, poor inspiratory effort. HEART: regular rate & rhythm and no murmurs and no lower extremity edema ABDOMEN: soft, non-tender and normal bowel sounds Musculoskeletal:no cyanosis of digits and no clubbing  PSYCH/ Neuro: Confused, unable to responds to commands properly, delayed verbal responses. No sensory deficits   Labs:   Recent Labs Lab 11/04/14 2000 11/05/14 0504 11/09/14 0541  WBC 27.5* 25.6* 22.8*  HGB 10.9* 10.4* 10.2*  HCT 34.4* 32.7* 32.9*  PLT 322 327 327  MCV 75.9* 76.2* 76.9*  MCH 24.1* 24.2* 23.8*  MCHC 31.7 31.8 31.0  RDW 16.4* 16.6* 17.0*  LYMPHSABS 0.3*  --   --   MONOABS 0.3  --   --   EOSABS 0.0  --   --   BASOSABS 0.0  --   --    Chemistries:   Recent Labs Lab 11/04/14 2000 11/05/14 0504 11/09/14 0541  NA 135 137 140  K 3.6 3.2* 3.8  CL 94* 98 95*  CO2 32 30 34*  GLUCOSE 207* 163* 158*  BUN 15 17 18   CREATININE 0.62 0.51 0.53  CALCIUM 8.2* 8.4 8.6  MG 2.1  --   --   AST 8  --   --   ALT 11  --   --   ALKPHOS 80  --   --   BILITOT 0.8  --   --    GFR Estimated Creatinine Clearance: 54.7 mL/min (by C-G formula based on Cr of 0.53).  Liver Function Tests:  Recent Labs Lab 11/04/14 2000  AST 8  ALT 11  ALKPHOS 80  BILITOT 0.8  PROT 6.1  ALBUMIN 2.7*   Imaging Studies: I have reviewed the scans Mr Kizzie Fantasia Contrast  11/06/2014   CLINICAL DATA:  Lung cancer and brain metastases.  EXAM: MRI HEAD WITHOUT AND WITH CONTRAST  TECHNIQUE: Multiplanar, multiecho pulse sequences of the brain and surrounding structures were obtained without and with intravenous contrast.  CONTRAST:  57mL MULTIHANCE GADOBENATE DIMEGLUMINE 529 MG/ML IV SOLN  COMPARISON:  Head CT 11/04/2014  FINDINGS: Images are mildly degraded by motion artifact. There is no evidence of acute infarct or extra-axial fluid collection.  Hemorrhagic right temporal lobe mass measures 5.4 x 4.6 x 4.3 cm with extensive surrounding vasogenic edema. 10 mm of  leftward midline shift is unchanged. Hemorrhagic right occipital lobe mass measures 3.7 x 2.5 cm with mild surrounding edema. Peripherally enhancing left temporal lobe mass measures 2.0 x 1.6 cm with mild surrounding edema. There is a 9 mm solidly enhancing mass in the left cerebellum with mild surrounding edema. There is right uncal herniation. There is no evidence of hydrocephalus.  Orbits are unremarkable. Paranasal sinuses and mastoid air cells are clear. Major intracranial vascular flow voids are preserved.  IMPRESSION: Four brain metastases, the largest being a 5.4 cm hemorrhagic right temporal lobe metastasis with extensive surrounding edema and 10 mm of unchanged midline shift.   Electronically Signed   By: Logan Bores   On: 11/06/2014 19:50   Assessment/Plan: 74 y.o.  Metastatic Lung Cancer to the brain Patient was admitted to hospital after found to have metastatic brain lesions She carries a recent diagnosis of Lung Cancer, per biopsy at [facility] in [facility] on 10/31/14 She is on Decadron and Keppra for seizure prophylaxis, please continue She may benefit from PET/CT outpatient to further evaluate for metastatic disease MRI brain on 3/17 showed Four brain metastases, the largest being a 5.4 cm hemorrhagic right temporal lobe metastasis with extensive surrounding edema and 10 mm of unchanged midline shift. Neurosurgery consult was obtained on 3/18 to discuss metastatic brain lesions. She is not a surgical candidate due to high risk involved. She is for brain simulation today for whole brain radiation Still awaiting records from [facility] we need this records for further evaluation. Will call pulmonologist today to try to obtain these records via his office.  She may be considered for inpatient rehab.  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  Weakness She will benefit from PT/OT  Full Code  Other medical issues as per admitting team   **Disclaimer: This note was dictated with voice recognition software. Similar sounding words can inadvertently be transcribed and this note may contain transcription errors which may not have been corrected upon publication of note.Sharene Butters E, PA-C 11/10/2014  6:56 AM Jionni Helming, MD 11/10/2014

## 2014-11-10 NOTE — Telephone Encounter (Signed)
Jenny,RN clled family just arrived, mom and sister, called back and informed [place] Mam, that we will come get patient at 45 and family to come with the patient, [place] Mam will infomr RN, spoke with Joellen Jersey RT Therapist and confirmed for [address]  Mark and start 10:08 AM

## 2014-11-10 NOTE — Progress Notes (Signed)
I took STD forms to [employer] wants call back once completed.  [phone].

## 2014-11-10 NOTE — Telephone Encounter (Signed)
called earlier and sked to speak with RN< Sonia Baller, patient has made a drastic change sine the weekend, more confused, drooling, unable to sign any consent, daughter is POA and isn't here as yet, spoke wih Dr. Clare Gandy time for CT simulation mark and start to 1000am, asked Jenny,RN to call me when patient's daughter is in ,she will need to come down with patient to sign consent for her mother,thanked Sonia Baller, Therapist, sports, spoke with Alm Bustard, RT Therapist and changed time,waiting on call from the daughter 8:46 AM

## 2014-11-10 NOTE — Progress Notes (Signed)
Chaplain visited with mother of patient and family friend. Chaplain listened and provided emotional support at the time of visit. Chaplain's visited was appreciated.

## 2014-11-10 NOTE — Progress Notes (Signed)
Chart reviewed for follow up. Note that patient has declined with increase in HA and lethargy.  Patient is not surgical candidate and plans underway to initiate total brain radiation. Will sign off for now. Please re-consult once medically appropriate and able to tolerate therapy.

## 2014-11-10 NOTE — Telephone Encounter (Signed)
Called room 1402 spoke with RN Sonia Baller, "No family has arrived, she has called all numbers in Epic spoke with patient's mother age 74, hasn't gotten in Frannie with daughter,let message," patient unable to sign consent,thanked Sonia Baller she will call us when family arrives,  called [place], RT therapist, gave updated status,will in basket MD'S 9:44 AM

## 2014-11-11 ENCOUNTER — Encounter: Payer: Self-pay | Admitting: Radiation Oncology

## 2014-11-11 ENCOUNTER — Encounter: Payer: Self-pay | Admitting: *Deleted

## 2014-11-11 ENCOUNTER — Telehealth: Payer: Self-pay | Admitting: *Deleted

## 2014-11-11 ENCOUNTER — Ambulatory Visit
Admit: 2014-11-11 | Discharge: 2014-11-11 | Disposition: A | Discharge status: Home / Self Care | Payer: Commercial Managed Care - PPO | Attending: Radiation Oncology | Admitting: Radiation Oncology

## 2014-11-11 VITALS — BP 165/80 | HR 102 | Resp 16

## 2014-11-11 DIAGNOSIS — Z515 Encounter for palliative care: Secondary | ICD-10-CM

## 2014-11-11 DIAGNOSIS — C3490 Malignant neoplasm of unspecified part of unspecified bronchus or lung: Secondary | ICD-10-CM

## 2014-11-11 DIAGNOSIS — R51 Headache: Secondary | ICD-10-CM

## 2014-11-11 DIAGNOSIS — R531 Weakness: Secondary | ICD-10-CM

## 2014-11-11 DIAGNOSIS — C7931 Secondary malignant neoplasm of brain: Secondary | ICD-10-CM

## 2014-11-11 DIAGNOSIS — R410 Disorientation, unspecified: Secondary | ICD-10-CM

## 2014-11-11 DIAGNOSIS — R519 Headache, unspecified: Secondary | ICD-10-CM

## 2014-11-11 DIAGNOSIS — G4700 Insomnia, unspecified: Secondary | ICD-10-CM

## 2014-11-11 DIAGNOSIS — G9340 Encephalopathy, unspecified: Secondary | ICD-10-CM | POA: Diagnosis present

## 2014-11-11 DIAGNOSIS — C801 Malignant (primary) neoplasm, unspecified: Secondary | ICD-10-CM

## 2014-11-11 DIAGNOSIS — R630 Anorexia: Secondary | ICD-10-CM

## 2014-11-11 MED ORDER — ALPRAZOLAM 0.25 MG PO TABS: 0.2500 mg | ORAL_TABLET | Freq: Once | ORAL | Status: DC

## 2014-11-11 MED ORDER — ENSURE PUDDING PO PUDG
1.0000 | Freq: Three times a day (TID) | ORAL | Status: DC | PRN
  Filled 2014-11-11: qty 1

## 2014-11-11 MED ORDER — HYDROCODONE-ACETAMINOPHEN 5-325 MG PO TABS
1.0000 | ORAL_TABLET | ORAL | Status: DC | PRN
  Administered 2014-11-12: 1 via ORAL
  Filled 2014-11-11: qty 1

## 2014-11-11 MED ORDER — LORAZEPAM 0.5 MG PO TABS
0.5000 mg | ORAL_TABLET | Freq: Once | ORAL | Status: AC
Start: 2014-11-11 — End: 2014-11-11
  Administered 2014-11-11: 0.5 mg via ORAL
  Filled 2014-11-11: qty 1

## 2014-11-11 NOTE — Progress Notes (Signed)
PT Cancellation Note  Patient Details Name: [name] [name] MRN: [id number] DOB: April 29, 1941   Cancelled Treatment:     Per RN pt is unable to tolerate and plans to get a Palliative Consult.  Will have LPT see next time to review case.   Nathanial Rancher 11/11/2014, 10:31 AM

## 2014-11-11 NOTE — Progress Notes (Signed)
[name] [name]   DOB:03/19/1941   SE#:[id number]   WVP#:[id number]  No care team member to display  I have seen the patient, examined her and edited the notes as follows  Subjective: Patient seen and examined. Afebrile. She is very weak, she continues to be very lethargic. Simulation could not start as patient could not lie flat.  Headaches are similar to those of than prior day. No respiratory or cardiac issues. Appetite decreased, and she has increased salivary secretions. Has not been ambulating.    Scheduled Meds: . amoxicillin-clavulanate  1 tablet Oral BID  . dexamethasone  10 mg Intravenous 4 times per day  . feeding supplement (RESOURCE BREEZE)  1 Container Oral BID BM  . levothyroxine  25 mcg Oral QAC breakfast  . multivitamin with minerals  1 tablet Oral Daily  . pantoprazole  40 mg Oral Q1200  . sodium chloride  3 mL Intravenous Q12H  . temazepam  7.5 mg Oral QHS   Continuous Infusions:   PRN Meds:acetaminophen **OR** acetaminophen, alum & mag hydroxide-simeth, guaiFENesin-dextromethorphan, HYDROcodone-acetaminophen, ipratropium-albuterol, ondansetron **OR** ondansetron (ZOFRAN) IV, polyethylene glycol   Objective:  Filed Vitals:   11/11/14 0443  BP: 147/76  Pulse: 54  Temp: 97.6 F (36.4 C)  Resp: 18      Intake/Output Summary (Last 24 hours) at 11/11/14 0806 Last data filed at 11/11/14 0146  Gross per 24 hour  Intake    120 ml  Output    850 ml  Net   -730 ml   GENERAL: confused, no distress, lethargic,  frail. SKIN: skin color, texture, turgor are normal, minimal echymmoses at the venipuncture sites. No significant lesions EYES: normal, conjunctiva are pink and non-injected, sclera clear OROPHARYNX:no exudate, no erythema and lips, buccal mucosa, and tongue normal  NECK: supple, thyroid normal size, non-tender, without nodularity LYMPH:  no palpable lymphadenopathy in the cervical, axillary or inguinal LUNGS:decreased breath sounds at the bases, poor inspiratory  effort. HEART: regular rate & rhythm and no murmurs and no lower extremity edema ABDOMEN: soft, non-tender and normal bowel sounds Musculoskeletal:no cyanosis of digits and no clubbing  PSYCH/ Neuro: Confused, unable to responds to commands properly, delayed verbal responses. Falls back asleep quickly. No sensory deficits   Labs:   Recent Labs Lab 11/04/14 2000 11/05/14 0504 11/09/14 0541  WBC 27.5* 25.6* 22.8*  HGB 10.9* 10.4* 10.2*  HCT 34.4* 32.7* 32.9*  PLT 322 327 327  MCV 75.9* 76.2* 76.9*  MCH 24.1* 24.2* 23.8*  MCHC 31.7 31.8 31.0  RDW 16.4* 16.6* 17.0*  LYMPHSABS 0.3*  --   --   MONOABS 0.3  --   --   EOSABS 0.0  --   --   BASOSABS 0.0  --   --    Chemistries:   Recent Labs Lab 11/04/14 2000 11/05/14 0504 11/09/14 0541  NA 135 137 140  K 3.6 3.2* 3.8  CL 94* 98 95*  CO2 32 30 34*  GLUCOSE 207* 163* 158*  BUN 15 17 18   CREATININE 0.62 0.51 0.53  CALCIUM 8.2* 8.4 8.6  MG 2.1  --   --   AST 8  --   --   ALT 11  --   --   ALKPHOS 80  --   --   BILITOT 0.8  --   --    GFR Estimated Creatinine Clearance: 54.7 mL/min (by C-G formula based on Cr of 0.53).  Liver Function Tests:  Recent Labs Lab 11/04/14 2000  AST 8  ALT  11  ALKPHOS 80  BILITOT 0.8  PROT 6.1  ALBUMIN 2.7*   Imaging Studies: Assessment/Plan: 74 y.o. None today  Metastatic Lung Cancer to the brain Patient was admitted to hospital after found to have metastatic brain lesions She carries a recent diagnosis of Lung Cancer, per biopsy at [facility] in [facility] on 10/31/14 She is on Decadron and Keppra for seizure prophylaxis, please continue She may benefit from PET/CT outpatient to further evaluate for metastatic disease MRI brain on 3/17 showed Four brain metastases, the largest being a 5.4 cm hemorrhagic right temporal lobe metastasis with extensive surrounding edema and 10 mm of unchanged midline shift. Neurosurgery consult was obtained on 3/18 to discuss metastatic brain lesions.  She is not a surgical candidate due to high risk involved. Brain simulation for whole brain radiation cancelled on 3/21, per nursing report, patient could not lie flat.  She is not currently a candidate for Inpatient Rehab due to present status.  Still awaiting records from [facility] we need this records for further evaluation. Requested records from pulmonologist on 3/21 but still no records were obtained.  Will try again today.  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  Weakness She is not currently a candidate for Inpatient Rehab due to present status.  Appreciate OT/PT attempts to mobilize patient.   Confusion Sedating agents were decreased in attempts to improve her mental status, but patient remains lethargic.  Unable to assess improvement this morning   Full Code  Other medical issues as per admitting team  Discharge planning Overall significant decline over the weekend. If she could not tolerate radiation, she may be a hospice candidate with comfort care measures. I will give her another 24-48 hours to see if she will clinically improve after weaning off some of her sedating medications.   **Disclaimer: This note was dictated with voice recognition software. Similar sounding words can inadvertently be transcribed and this note may contain transcription errors which may not have been corrected upon publication of note.Sharene Butters E, PA-C 11/11/2014  8:06 AM  Makeisha Jentsch, MD 11/11/2014

## 2014-11-11 NOTE — Telephone Encounter (Signed)
Called and spoke with Erasmo Downer ,RN asked how patient status was, ,no changes from yesterday stated, patient was unable to get treatment yesterday unable to lie still , asked that RN call MD and request medication to help patient to lie still for treatment ,Erasmo Downer will call patient's MD and see ,thanked RN 10:06 AM

## 2014-11-11 NOTE — Progress Notes (Signed)
Full note to follow:  I met today with [name] [name] (slept most of the conversation) and with her daughter, [name] [name], and friend, [name]. We discussed palliative care and that we are here for support and to help talk through decisions they may be faced with. They updated me on her symptoms and how the past month has led them here with 1 month of headaches, sleeping, and decreased appetite (only intake crackers/water per [name] [name]). Then she had a cold which turned to pneumonia that did not resolve resulting in chest CT and biopsy. Now she has been weak, sleepy and intermittent confusion. They have attempted whole brain radiation for which she has not been able to tolerate but they will try once more today. [name] [name] commented to me "things are bad?" and I answered yes - she asked me how long and I did tell her it could be weeks but it could be days. She is not eating and there is question of aspiration - I am sure she is much to weak for a good swallow. They tell me that she has been very clear that she wants to live and wants everything done. [name] [name] mentioned feeding tube and I did tell her there are many decisions that we will likely need to discuss but we have to keep in mind what we are treating with the things we are putting her through. Unfortunately there are no good options to treat this cancer in her debilitated state and I do not think this is going to get any better for her. They were very understanding and we will meet again tomorrow and discuss with her sister, [name], via telephone.   Vinie Sill, NP Palliative Medicine Team Pager # [phone] (M-F 8a-5p) Team Phone # [phone] (Nights/Weekends)

## 2014-11-11 NOTE — Progress Notes (Signed)
Patient still refusing to keep head  mask on after attempting 3 differemnt times, warm blanket offered, sips of water, patient taken back to her room, reportt to [employer], will in basket Dr. Lisbeth Renshaw 3:24 PM

## 2014-11-11 NOTE — Progress Notes (Addendum)
[facility]                                                                              Patient Demographics  [name] [name], is a 74 y.o. female, DOB - 05/05/1941, GEX:[id number]  Admit date - 11/04/2014   Admitting Physician Nat Math, MD  Outpatient Primary MD for the patient is No primary care provider on file.  LOS - 7   No chief complaint on file.      Brief HPI   74 year old female with COPD and tobacco dependency just diagnosed of lung CA(lung biopsy done by [facility] in [facility] on 10/31/14, and results confirming malignancy shared with patient and family today), who comes in as a referral from [facility] regional ED to be followed by Dr. Alvy Bimler here, after she was found on the floor by family members this morning. Apparently, she could not get up on her on and this is likely a result of metastatic brain lesions. Patient is currently undergoing further evaluation and recommendations by specialist involved.   Assessment & Plan    Principal Problem:   Acute encephalopathy: Still somewhat confused -  likely due to brain metastasis, medication effect  - Continue pain medications, decreased, IV Decadron, Restoril  - Discussed in detail with patient's sister and daughter at the bedside, they requested IV anxiety medication, foley cath  Active Problems:   Lung cancer with   Brain metastases - MRI brain showed for her brain metastasis, largest being 5.4 cm hemorrhagic right temporal lobe metastasis with extensive surrounding edema and a 10 mm of unchanged midline shift. - Appreciate radiation oncology and oncology recommendations - Was unable to do XRT simulation yesterday as she was unable to lie flat - Continue Decadron - Palliative medicine consult placed for goals of care, meets palliative medicine screening   pneumonia - Continue Augmentin, DC after today's dose  COPD (chronic obstructive pulmonary disease) - Stable, continue bronchodilators as  needed   Leucocytosis - Most likely secondary to Decadron for brain mets   Hypothyroidism - TSH 8.9, free T4 0.85, low T3, on Synthroid - Will need repeat TSH levels in 4-6 weeks as outpatient   Protein-calorie malnutrition, severe - Nutrition consulted,  Code Status: Full code  Family Communication: Discussed in detail with the patient, all imaging results, lab results explained to sister and her daughter at the bedside    Disposition Plan:   Time Spent in minutes  37mins  Procedures  MRI brain  Consults   Oncology   radiation oncology  DVT Prophylaxis SCD's  Medications  Scheduled Meds: . amoxicillin-clavulanate  1 tablet Oral BID  . dexamethasone  10 mg Intravenous 4 times per day  . feeding supplement (RESOURCE BREEZE)  1 Container Oral BID BM  . levothyroxine  25 mcg Oral QAC breakfast  . multivitamin with minerals  1 tablet Oral Daily  . pantoprazole  40 mg Oral Q1200  . sodium chloride  3 mL Intravenous Q12H  . temazepam  7.5 mg Oral QHS   Continuous Infusions:  PRN Meds:.acetaminophen **OR** acetaminophen, alum & mag hydroxide-simeth, guaiFENesin-dextromethorphan, HYDROcodone-acetaminophen, ipratropium-albuterol, ondansetron **OR** ondansetron (ZOFRAN) IV, polyethylene glycol   Antibiotics  Anti-infectives    Start     Dose/Rate Route Frequency Ordered Stop   11/07/14 1300  amoxicillin-clavulanate (AUGMENTIN) 875-125 MG per tablet 1 tablet     1 tablet Oral 2 times daily 11/07/14 1137     11/04/14 2300  piperacillin-tazobactam (ZOSYN) IVPB 3.375 g  Status:  Discontinued     3.375 g 12.5 mL/hr over 240 Minutes Intravenous 3 times per day 11/04/14 2254 11/07/14 1137        Subjective:   [name] [name] was seen and examined today.  Still confused, unable to provide meaningful review of systems., Continues to state " I'm okay". No acute events overnight.    Objective:   Blood pressure 147/76, pulse 54, temperature 97.6 F (36.4 C), temperature  source Oral, resp. rate 18, height 5\' 6"  (1.676 m), weight 55.339 kg (122 lb), SpO2 98 %.  Wt Readings from Last 3 Encounters:  11/04/14 55.339 kg (122 lb)  11/05/14 45.36 kg (100 lb)  06/28/10 62.316 kg (137 lb 6.1 oz)     Intake/Output Summary (Last 24 hours) at 11/11/14 1158 Last data filed at 11/11/14 0900  Gross per 24 hour  Intake     60 ml  Output    750 ml  Net   -690 ml    Exam  General: Ale, confused, NAD  HEENT:  PERRLA, EOMI, Anicteic Sclera, mucous membranes moist.   Neck: Supple, no JVD, no masses  CVS: S1 S2 auscultated, no rubs, murmurs or gallops. Regular rate and rhythm.  Respiratory: Clear to auscultation bilaterally, no wheezing, rales or rhonchi  Abdomen: Soft, nontender, nondistended, + bowel sounds  Ext: no cyanosis clubbing or edema  Neuro: AAOx3, Cr N's II- XII. Strength 5/5 upper and lower extremities bilaterally  Skin: No rashes  Psych: confused   Data Review   Micro Results Recent Results (from the past 240 hour(s))  Culture, Urine     Status: None   Collection Time: 11/05/14  5:25 AM  Result Value Ref Range Status   Specimen Description URINE, CLEAN CATCH  Final   Special Requests NONE  Final   Colony Count NO GROWTH Performed at [facility]   Final   Culture NO GROWTH Performed at [facility]   Final   Report Status 11/06/2014 FINAL  Final    Radiology Reports Mr Kizzie Fantasia Contrast  11/06/2014   CLINICAL DATA:  Lung cancer and brain metastases.  EXAM: MRI HEAD WITHOUT AND WITH CONTRAST  TECHNIQUE: Multiplanar, multiecho pulse sequences of the brain and surrounding structures were obtained without and with intravenous contrast.  CONTRAST:  69mL MULTIHANCE GADOBENATE DIMEGLUMINE 529 MG/ML IV SOLN  COMPARISON:  Head CT 11/04/2014  FINDINGS: Images are mildly degraded by motion artifact. There is no evidence of acute infarct or extra-axial fluid collection.  Hemorrhagic right temporal lobe mass measures 5.4 x 4.6  x 4.3 cm with extensive surrounding vasogenic edema. 10 mm of leftward midline shift is unchanged. Hemorrhagic right occipital lobe mass measures 3.7 x 2.5 cm with mild surrounding edema. Peripherally enhancing left temporal lobe mass measures 2.0 x 1.6 cm with mild surrounding edema. There is a 9 mm solidly enhancing mass in the left cerebellum with mild surrounding edema. There is right uncal herniation. There is no evidence of hydrocephalus.  Orbits are unremarkable. Paranasal sinuses and mastoid air cells are clear. Major intracranial vascular flow voids are preserved.  IMPRESSION: Four brain metastases, the largest being a 5.4 cm hemorrhagic right temporal lobe metastasis  with extensive surrounding edema and 10 mm of unchanged midline shift.   Electronically Signed   By: Logan Bores   On: 11/06/2014 19:50   Portable Chest 1 View  11/04/2014   CLINICAL DATA:  COPD and lung cancer.  Headache.  EXAM: PORTABLE CHEST - 1 VIEW  COMPARISON:  11/04/2014  FINDINGS: Again noted is a large density in the left upper lung compatible with a mass. Persistent densities at the left lung base suggest pleural fluid and volume loss. Coarse lung markings compatible with chronic changes and emphysema. Heart and mediastinum appear to be stable. Negative for a pneumothorax.  IMPRESSION: Stable chest radiograph findings.  Stable volume loss in the left hemithorax. Again noted is a large lesion involving the left upper lobe and suspect left pleural fluid.   Electronically Signed   By: Markus Daft M.D.   On: 11/04/2014 21:03   Dg Outside Films Chest  11/07/2014   CLINICAL DATA:    This exam is stored here for comparison purposes only and was performed at  an outside facility.   Please contact the originating institution for any  associated interpretation or report.    Dg Outside Films Chest  11/07/2014   CLINICAL DATA:    This exam is stored here for comparison purposes only and was performed at  an outside facility.   Please  contact the originating institution for any  associated interpretation or report.    Dg Outside Films Chest  11/07/2014   CLINICAL DATA:    This exam is stored here for comparison purposes only and was performed at  an outside facility.   Please contact the originating institution for any  associated interpretation or report.    Dg Outside Films Chest  11/07/2014   CLINICAL DATA:    This exam is stored here for comparison purposes only and was performed at  an outside facility.   Please contact the originating institution for any  associated interpretation or report.    Ct Outside Films Head/face  11/07/2014   CLINICAL DATA:    This exam is stored here for comparison purposes only and was performed at  an outside facility.   Please contact the originating institution for any  associated interpretation or report.    Ct Outside Films Head/face  11/07/2014   CLINICAL DATA:    This exam is stored here for comparison purposes only and was performed at  an outside facility.   Please contact the originating institution for any  associated interpretation or report.     CBC  Recent Labs Lab 11/04/14 2000 11/05/14 0504 11/09/14 0541  WBC 27.5* 25.6* 22.8*  HGB 10.9* 10.4* 10.2*  HCT 34.4* 32.7* 32.9*  PLT 322 327 327  MCV 75.9* 76.2* 76.9*  MCH 24.1* 24.2* 23.8*  MCHC 31.7 31.8 31.0  RDW 16.4* 16.6* 17.0*  LYMPHSABS 0.3*  --   --   MONOABS 0.3  --   --   EOSABS 0.0  --   --   BASOSABS 0.0  --   --     Chemistries   Recent Labs Lab 11/04/14 2000 11/05/14 0504 11/09/14 0541  NA 135 137 140  K 3.6 3.2* 3.8  CL 94* 98 95*  CO2 32 30 34*  GLUCOSE 207* 163* 158*  BUN 15 17 18   CREATININE 0.62 0.51 0.53  CALCIUM 8.2* 8.4 8.6  MG 2.1  --   --   AST 8  --   --   ALT  11  --   --   ALKPHOS 80  --   --   BILITOT 0.8  --   --    ------------------------------------------------------------------------------------------------------------------ estimated creatinine clearance is 54.7  mL/min (by C-G formula based on Cr of 0.53). ------------------------------------------------------------------------------------------------------------------ No results for input(s): HGBA1C in the last 72 hours. ------------------------------------------------------------------------------------------------------------------ No results for input(s): CHOL, HDL, LDLCALC, TRIG, CHOLHDL, LDLDIRECT in the last 72 hours. ------------------------------------------------------------------------------------------------------------------ No results for input(s): TSH, T4TOTAL, T3FREE, THYROIDAB in the last 72 hours.  Invalid input(s): FREET3 ------------------------------------------------------------------------------------------------------------------ No results for input(s): VITAMINB12, FOLATE, FERRITIN, TIBC, IRON, RETICCTPCT in the last 72 hours.  Coagulation profile  Recent Labs Lab 11/04/14 2000  INR 1.09    No results for input(s): DDIMER in the last 72 hours.  Cardiac Enzymes No results for input(s): CKMB, TROPONINI, MYOGLOBIN in the last 168 hours.  Invalid input(s): CK ------------------------------------------------------------------------------------------------------------------ Invalid input(s): POCBNP  No results for input(s): GLUCAP in the last 72 hours.   RAI,RIPUDEEP M.D. [facility] 11/11/2014, 11:58 AM  Pager: [phone]   Between 7am to 7pm - call Pager - [phone]  After 7pm go to [email or web address] - password [email or web address]  Call night coverage person covering after 7pm

## 2014-11-11 NOTE — Progress Notes (Signed)
Patient gave name, checked ID Band for identification, verified 0.5mg  po ativan with Santiago Glad Hess,RN, gave 0.5mg  ativan tablet SL x1 as ordered by Dr. Lisbeth Renshaw, patient vitals taken, patient asked if she wanted treatment"yes,, then whn therapists placed mask on patient face, she ckept saying"take it off,bring hr hands up to the mask"will giver anopther 10 minutes to se if ativan will help 2:59 PM

## 2014-11-11 NOTE — Progress Notes (Signed)
3.22.16:  Rec'd [employer] short term disability paperwork - forward to RN for processing

## 2014-11-11 NOTE — Telephone Encounter (Signed)
Jeralyn Ruths, RN, patient unable to hold still, kept putting hands up to the mask,unable to be still, refused 3 times ,will try again at 2:15pm will try and give (0.5mg  ativan sl x 1 per verbal order Dr.Moody) if patient will be agreeable then, will inform MD on call here in Rad/Onc as well 11:52 AM

## 2014-11-11 NOTE — Progress Notes (Signed)
  Radiation Oncology         [phone] ________________________________  Name: [name] [name] MRN: [id number]  Date: 11/10/2014  DOB: [birth year]    Simulation and treatment planning note  The patient presented for simulation for the patient's upcoming course of whole brain radiation treatment. The patient was placed in a supine position and a customized thermoplastic head cast was constructed to aid in patient immobilization during the treatment. This complex treatment device will be used on a daily basis. In this fashion a CT scan was obtained through the head and neck region and isocenter was placed near midline within the brain.  The patient will be planned to receive a course of whole brain radiation treatment to a dose of 30 gray in 10 fractions at 3 gray per fraction. To accomplish this, 2 customized blocks have been designed which corresponds to left and right whole brain radiation fields. These 2 complex treatment devices will be used on a daily basis during the course of radiation. An additional 2 customized fields also have been designed using a reduced field technique. A total of 4 treatment devices/feels therefore will be used. A complex isodose plan is requested to insure that the target area is adequately covered in to facilitate optimization of the treatment plan. A forward planning technique will also be evaluated to determine if this approach significantly improves the plan.   ________________________________   Jodelle Gross, MD, PhD

## 2014-11-11 NOTE — Telephone Encounter (Signed)
error 

## 2014-11-11 NOTE — Consult Note (Signed)
Patient HQ:[name]      DOB: 11-13-1940      BWG:[id number]     Consult Note from the Palliative Medicine Team at [facility] Requested by: Dr. Tana Coast     PCP: No primary care provider on file. Reason for Consultation: [place]     Phone Number:None  Assessment of patients Current state: I met today with [name] [name] (slept most of the conversation) and with her daughter, [name] [name], and friend, [name] [name]. We discussed palliative care and that we are here for support and to help talk through decisions they may be faced with. They updated me on her symptoms and how the past month has led them here with 1 month of headaches, sleeping, and decreased appetite (only intake crackers/water per [name] [name]). Then she had a cold which turned to pneumonia that did not resolve resulting in chest CT and biopsy. Now she has been weak, sleepy and intermittent confusion. They have attempted whole brain radiation for which she has not been able to tolerate but they will try once more today. [name] [name] commented to me "things are bad?" and I answered yes - she asked me how long and I did tell her it could be weeks but it could be days. She is not eating and there is question of aspiration - I am sure she is much to weak for a good swallow. They tell me that she has been very clear that she wants to live and wants everything done. [name] [name] mentioned feeding tube and I did tell her there are many decisions that we will likely need to discuss but we have to keep in mind what we are treating with the things we are putting her through. Unfortunately there are no good options to treat this cancer in her debilitated state and I do not think this is going to get any better for her. They were very understanding and we will meet again tomorrow and discuss with her sister, [name] [name], via telephone.    Goals of Care: 1.  Code Status: FULL    2. Disposition: To be determined.    3. Symptom Management:   1. Weakness: Continue medical  management.  2. Decreased appetite: I have changed to soft diet and will order Ensure pudding prn. She is not eating more than a couple bites of her meals. There is a question of aspiration and I will assess this further tomorrow with family.  3. Headache: Continue decadron 10 mg QID - this is a large dose and I would not increase. Continue Vicodin 5-325 1 tablet every 4 hours for headache. We may consider liquid oxycodone as well if concerned with swallow. Dosage may be escalated but she is already so lethargic and is not comfort care at this time.   4. Psychosocial: Emotional support provided to patient and family/friend at bedside during difficult conversation.    Brief HPI: 74 yo female with COPD and tobacco dependency just diagnosed of lung CA (lung biopsy done by [facility] in [facility] on 10/31/14, and results confirming malignancy has since been shared with patient and family), who comes in as a referral from [facility] regional ED to be followed by Dr. Alvy Bimler here, after she was found on the floor by family members this morning. Apparently, she could not get up on her on and this is likely a result of metastatic brain lesions. Patient is currently undergoing further evaluation and recommendations by specialist involved. She has not been able to tolerate  whole brain radiation and is declining significantly. There are little other options at this point but to focus on comfort care. PMH reviewed.    ROS: Unable to assess - very lethargic.     PMH:  Past Medical History  Diagnosis Date  . COPD (chronic obstructive pulmonary disease)   . Stroke 11/03/14    per family  . Pneumonia Feb 2016  . Headache   . Cancer Mar. 2016    primary lung with mets to brain     PSH: Past Surgical History  Procedure Laterality Date  . No past surgeries     I have reviewed the [place] and SH and  If appropriate update it with new information. Allergies  Allergen Reactions  . Bupropion Hcl     REACTION:  hives  . Codeine     REACTION: nausea but can tolerate with food   Scheduled Meds: . ALPRAZolam  0.25 mg Oral Once  . amoxicillin-clavulanate  1 tablet Oral BID  . dexamethasone  10 mg Intravenous 4 times per day  . feeding supplement (RESOURCE BREEZE)  1 Container Oral BID BM  . levothyroxine  25 mcg Oral QAC breakfast  . multivitamin with minerals  1 tablet Oral Daily  . pantoprazole  40 mg Oral Q1200  . sodium chloride  3 mL Intravenous Q12H  . temazepam  7.5 mg Oral QHS   Continuous Infusions:  PRN Meds:.acetaminophen **OR** acetaminophen, alum & mag hydroxide-simeth, guaiFENesin-dextromethorphan, HYDROcodone-acetaminophen, ipratropium-albuterol, ondansetron **OR** ondansetron (ZOFRAN) IV, polyethylene glycol    BP 154/47 mmHg  Pulse 53  Temp(Src) 97.6 F (36.4 C) (Axillary)  Resp 16  Ht _0  (1.676 m)  Wt 55.339 kg (122 lb)  BMI 19.70 kg/m2  SpO2 97%   PPS: 10%   Intake/Output Summary (Last 24 hours) at 11/11/14 1517 Last data filed at 11/11/14 1300  Gross per 24 hour  Intake    120 ml  Output    550 ml  Net   -430 ml   LBM: 3/14 ??  Physical Exam:  General: NAD, sitting up in bed, thin, frail, tense HEENT: Temporal muscle wasting, no JVD Chest: Slightly labored at times, no tachypnea CVS: RRR Abdomen: Soft, NT, ND Ext: MAE, no edema, warm to touch Neuro: Very lethargic, oriented to person but unable to assess further d/t lethargy   Labs: CBC    Component Value Date/Time   WBC 22.8* 11/09/2014 0541   RBC 4.28 11/09/2014 0541   HGB 10.2* 11/09/2014 0541   HCT 32.9* 11/09/2014 0541   PLT 327 11/09/2014 0541   MCV 76.9* 11/09/2014 0541   MCH 23.8* 11/09/2014 0541   MCHC 31.0 11/09/2014 0541   RDW 17.0* 11/09/2014 0541   LYMPHSABS 0.3* 11/04/2014 2000   MONOABS 0.3 11/04/2014 2000   EOSABS 0.0 11/04/2014 2000   BASOSABS 0.0 11/04/2014 2000    BMET    Component Value Date/Time   NA 140 11/09/2014 0541   K 3.8 11/09/2014 0541   CL 95*  11/09/2014 0541   CO2 34* 11/09/2014 0541   GLUCOSE 158* 11/09/2014 0541   BUN 18 11/09/2014 0541   CREATININE 0.53 11/09/2014 0541   CALCIUM 8.6 11/09/2014 0541   GFRNONAA >90 11/09/2014 0541   GFRAA >90 11/09/2014 0541    CMP     Component Value Date/Time   NA 140 11/09/2014 0541   K 3.8 11/09/2014 0541   CL 95* 11/09/2014 0541   CO2 34* 11/09/2014 0541   GLUCOSE 158* 11/09/2014 0541  BUN 18 11/09/2014 0541   CREATININE 0.53 11/09/2014 0541   CALCIUM 8.6 11/09/2014 0541   PROT 6.1 11/04/2014 2000   ALBUMIN 2.7* 11/04/2014 2000   AST 8 11/04/2014 2000   ALT 11 11/04/2014 2000   ALKPHOS 80 11/04/2014 2000   BILITOT 0.8 11/04/2014 2000   GFRNONAA >90 11/09/2014 0541   GFRAA >90 11/09/2014 0541     Time In Time Out Total Time Spent with Patient Total Overall Time  1415 1515 54mn 666m    Greater than 50%  of this time was spent counseling and coordinating care related to the above assessment and plan.  AlVinie SillNP Palliative Medicine Team Pager # [phone]-F 8a-5p) Team Phone # [phone]/Weekends)

## 2014-11-12 ENCOUNTER — Ambulatory Visit
Admit: 2014-11-12 | Discharge: 2014-11-12 | Disposition: A | Discharge status: Home / Self Care | Payer: Commercial Managed Care - PPO | Attending: Radiation Oncology | Admitting: Radiation Oncology

## 2014-11-12 ENCOUNTER — Ambulatory Visit: Payer: Commercial Managed Care - PPO

## 2014-11-12 DIAGNOSIS — Z66 Do not resuscitate: Secondary | ICD-10-CM

## 2014-11-12 DIAGNOSIS — D72829 Elevated white blood cell count, unspecified: Secondary | ICD-10-CM

## 2014-11-12 MED ORDER — OXYCODONE HCL 5 MG/5ML PO SOLN: 5.0000 mg | ORAL | Status: DC | PRN

## 2014-11-12 MED ORDER — MORPHINE SULFATE 2 MG/ML IJ SOLN: 0.5000 mg | INTRAMUSCULAR | Status: AC | PRN

## 2014-11-12 MED ORDER — DEXAMETHASONE SODIUM PHOSPHATE 10 MG/ML IJ SOLN: 10.0000 mg | Freq: Four times a day (QID) | INTRAMUSCULAR | Status: AC

## 2014-11-12 MED ORDER — ENSURE PUDDING PO PUDG: 1.0000 | Freq: Three times a day (TID) | ORAL | Status: AC | PRN

## 2014-11-12 MED ORDER — IPRATROPIUM-ALBUTEROL 0.5-2.5 (3) MG/3ML IN SOLN: 3.0000 mL | RESPIRATORY_TRACT | Status: AC | PRN

## 2014-11-12 MED ORDER — OXYCODONE HCL 5 MG/5ML PO SOLN: 5.0000 mg | ORAL | Status: AC | PRN

## 2014-11-12 MED ORDER — ONDANSETRON HCL 4 MG/2ML IJ SOLN: 4.0000 mg | Freq: Four times a day (QID) | INTRAMUSCULAR | Status: AC | PRN

## 2014-11-12 MED ORDER — GUAIFENESIN-DM 100-10 MG/5ML PO SYRP: 5.0000 mL | ORAL_SOLUTION | ORAL | Status: AC | PRN

## 2014-11-12 MED ORDER — TEMAZEPAM 7.5 MG PO CAPS
7.5000 mg | ORAL_CAPSULE | Freq: Every day | ORAL | Status: AC
Start: 2014-11-12 — End: ?

## 2014-11-12 MED ORDER — BOOST / RESOURCE BREEZE PO LIQD: 1.0000 | Freq: Two times a day (BID) | ORAL | Status: DC | PRN

## 2014-11-12 MED ORDER — BISACODYL 10 MG RE SUPP: 10.0000 mg | Freq: Every day | RECTAL | Status: DC | PRN

## 2014-11-12 MED ORDER — MORPHINE SULFATE 2 MG/ML IJ SOLN: 0.5000 mg | INTRAMUSCULAR | Status: DC | PRN

## 2014-11-12 NOTE — Discharge Summary (Signed)
Physician Discharge Summary   Patient ID: [name] [name] MRN: [id number] DOB/AGE: Dec 20, 1940 74 y.o.  Admit date: 11/04/2014 Discharge date: 11/12/2014  Primary Care Physician:  No primary care provider on file.  Discharge Diagnoses:    . newly diagnosed Lung cancer with brain metastasis  . Protein-calorie malnutrition, severe . Acute encephalopathy . COPD (chronic obstructive pulmonary disease) . Tobacco dependency . Pneumonia due to anaerobes . Leucocytosis . Hypothyroidism   Consults:  Oncology, Dr. Alvy Bimler Radiation oncology, Dr. Lisbeth Renshaw Palliative medicine Inpatient rehabilitation   Recommendations for Outpatient Follow-up:   Patient is currently comfort care status, DO NOT RESUSCITATE  Diet: comfort food   TESTS THAT NEED FOLLOW-UP none   DIET: comfort    Allergies:   Allergies  Allergen Reactions  . Bupropion Hcl     REACTION: hives  . Codeine     REACTION: nausea but can tolerate with food     Discharge Medications:   Medication List    STOP taking these medications        ibuprofen 200 MG tablet  Commonly known as:  ADVIL,MOTRIN     VITAMIN B 12 PO     VITAMIN D PO     zolpidem 10 MG tablet  Commonly known as:  AMBIEN      TAKE these medications        dexamethasone 10 MG/ML injection  Commonly known as:  DECADRON  Inject 1 mL (10 mg total) into the vein every 6 (six) hours.     feeding supplement (ENSURE) Pudg  Take 1 Container by mouth 3 (three) times daily as needed (as desired and awake enough for intake).     guaiFENesin-dextromethorphan 100-10 MG/5ML syrup  Commonly known as:  ROBITUSSIN DM  Take 5 mLs by mouth every 4 (four) hours as needed for cough.     ipratropium-albuterol 0.5-2.5 (3) MG/3ML Soln  Commonly known as:  DUONEB  Take 3 mLs by nebulization every 4 (four) hours as needed.     morphine 2 MG/ML injection  Inject 0.25 mLs (0.5 mg total) into the vein every 3 (three) hours as needed.     ondansetron 4  MG/2ML Soln injection  Commonly known as:  ZOFRAN  Inject 2 mLs (4 mg total) into the vein every 6 (six) hours as needed for nausea.     oxyCODONE 5 MG/5ML solution  Commonly known as:  ROXICODONE  Take 5 mLs (5 mg total) by mouth every 2 (two) hours as needed for moderate pain (headache).     temazepam 7.5 MG capsule  Commonly known as:  RESTORIL  Take 1 capsule (7.5 mg total) by mouth at bedtime.         Brief H and P: For complete details please refer to admission H and P per Dr. Sanjuana Letters on 3/15, but in brief, 74 year old female with COPD and tobacco dependency just diagnosed of lung CA (lung biopsy done by [facility] in [facility] on 10/31/14, and results confirming malignancy shared with patient and family on the day of admission), who presented in as a referral from [facility] regional ED to be followed by Dr. Alvy Bimler here, after she was found on the floor by family members on the morning of admission.  Apparently, she could not get up on her on and this is likely a result of metastatic brain lesions. Patient was admitted for further evaluation.  Hospital Course:  [name] [name] is a pleasant 74 year old female with COPD and tobacco dependency just diagnosed of lung  CA(lung biopsy done by [facility] in [facility] on 10/31/14, and results confirming malignancy shared with patient and family 3/15), who comes in as a referral from [facility] regional ED to be followed by Dr. Alvy Bimler here, after she was found on the floor by family members this morning. Apparently, she could not get up on her on and this is likely a result of metastatic brain lesions. However, patient's chest x-ray at the time of admission reported a large lesion involving the left upper lobe and suspected left pleural fluid.  Labs showed white count of 27,500, potassium 3.6, TSH 8.98. Of note is that patient received high-dose dexamethasone at [facility]. There was question whether she may have postobstructive pneumonia. Patient  was admitted for further workup.   Lung cancer with Brain metastases  MRI brain showed for her brain metastasis, largest being 5.4 cm hemorrhagic right temporal lobe metastasis with extensive surrounding edema and a 10 mm of unchanged midline shift.Radiation oncology and oncology were consulted. Patient was continued on high dose IV Decadron. Patient was able to complete her simulation for radiation therapy however was not able to tolerate radiation treatment on 3 occasions despite using anxiolytics. She has undergone a significant decline in her overall status and hence her radiation oncology no plans to attempt further radiation treatment. Patient was closely followed by oncology, Dr Alvy Bimler , recommended palliative approach. Patient continued to remain lethargic with encephalopathy despite weaning off sedatives and pain medications. Palliative medicine consult was placed, goals of care meeting was held with patient and her family. Given overall poor prognosis and significant decline recently, her family requested for transitioning to comfort care. Patient is not eating more than couple of bites of her meals. Palliative medicine recommended continuing the IV Decadron, IV Zofran and IV morphine as needed also placed on the oxycodone sublingual as needed.  Patient will be discharged to residential hospice, has a bed at [facility].    Acute encephalopathy: Still confused and lethargic, likely due to brain metastasis and medication effect. Continue IV Decadron, Restoril, no significant improvement despite weaning pain medications and sedatives.   Currently comfort care   pneumonia, possibly postobstructive  - Patient has completed full course of Augmentin during the hospitalization, does not need any further antibiotics.  COPD (chronic obstructive pulmonary disease) - Stable, continue bronchodilators as needed   Leucocytosis - Most likely secondary to Decadron for brain mets    Hypothyroidism - TSH 8.9, free T4 0.85, low T3,  patient was placed on Synthroid, now comfort care   Protein-calorie malnutrition, severe - Nutrition consulted, continue Ensure as needed if patient is alert and awake    Day of Discharge BP 157/54 mmHg  Pulse 68  Temp(Src) 97.6 F (36.4 C) (Oral)  Resp 16  Ht 5\' 6"  (1.676 m)  Wt 55.339 kg (122 lb)  BMI 19.70 kg/m2  SpO2 97%  Physical Exam: General: Somewhat lethargic but confused, frail and cachectic  CVS: S1-S2 clear no murmur rubs or gallops Chest: clear to auscultation bilaterally, no wheezing rales or rhonchi Abdomen: soft nontender, nondistended, normal bowel sounds Extremities: no cyanosis, clubbing or edema noted bilaterally    The results of significant diagnostics from this hospitalization (including imaging, microbiology, ancillary and laboratory) are listed below for reference.    LAB RESULTS: Basic Metabolic Panel:  Recent Labs Lab 11/09/14 0541  NA 140  K 3.8  CL 95*  CO2 34*  GLUCOSE 158*  BUN 18  CREATININE 0.53  CALCIUM 8.6  Liver Function Tests: No results for input(s): AST, ALT, ALKPHOS, BILITOT, PROT, ALBUMIN in the last 168 hours. No results for input(s): LIPASE, AMYLASE in the last 168 hours. No results for input(s): AMMONIA in the last 168 hours. CBC:  Recent Labs Lab 11/09/14 0541  WBC 22.8*  HGB 10.2*  HCT 32.9*  MCV 76.9*  PLT 327   Cardiac Enzymes: No results for input(s): CKTOTAL, CKMB, CKMBINDEX, TROPONINI in the last 168 hours. BNP: Invalid input(s): POCBNP CBG: No results for input(s): GLUCAP in the last 168 hours.  Significant Diagnostic Studies:  No results found.  2D ECHO:   Disposition and Follow-up:    DISPOSITION:  we can place   DISCHARGE FOLLOW-UP Follow-up Information    Follow up In 1 week.   Why:  If symptoms worsen, As needed   Contact information:   Youth worker MD       Follow up with [facility], NI, MD. Schedule an appointment as  soon as possible for a visit in 2 weeks.   Specialty:  Hematology and Oncology   Why:  for hospital follow-up, As needed   Contact information:   [place] [id number] [phone]        Time spent on Discharge:  35 minutes  Signed:   Aristidis Talerico M.D. [facility] 11/12/2014, 11:59 AM Pager: [phone]

## 2014-11-12 NOTE — Consult Note (Signed)
[facility] Liaison: [place] room available for patient today. Spoke with [employer] by phone to confirm. She gave permission for her mother to sign forms in her absence. Discharge summary faxed. RN please call report to [phone]. Understand from [place] scheduled for 1 PM. Thank you. Erling Conte LCSW [phone]

## 2014-11-12 NOTE — Progress Notes (Signed)
Nutrition Brief Note  Chart reviewed. Pt now transitioning to comfort care -per SW  No further nutrition interventions warranted at this time.  Please re-consult as needed.   Clayton Bibles, MS, RD, LDN Pager: [phone] After Hours Pager: [phone]

## 2014-11-12 NOTE — Progress Notes (Signed)
Patient is set to discharge to [facility] today. Patient, daughter & friend at bedside aware, along with sister, Venita Sheffield. Discharge packet given to RN, Shirlee Limerick. [facility] called for transport.     Raynaldo Opitz, [facility] Clinical Social Worker cell #: [phone]

## 2014-11-12 NOTE — Progress Notes (Signed)
[name] [name]   DOB:1941/03/08   ME#:[id number]   IWL#:[id number]  No care team member to display  I have seen the patient, examined her and edited the notes as follows  Subjective: Patient seen and examined. Afebrile. Clinical decline noted. She is weak and lethargic, sleeping most of the day, unable to eat. Simulation on 3/22 could not be administered due to agitation. Headaches are similar to those of than prior day. No apparent respiratory or cardiac issues.  Scheduled Meds: . amoxicillin-clavulanate  1 tablet Oral BID  . dexamethasone  10 mg Intravenous 4 times per day  . feeding supplement (RESOURCE BREEZE)  1 Container Oral BID BM  . levothyroxine  25 mcg Oral QAC breakfast  . multivitamin with minerals  1 tablet Oral Daily  . pantoprazole  40 mg Oral Q1200  . sodium chloride  3 mL Intravenous Q12H  . temazepam  7.5 mg Oral QHS   Continuous Infusions:   PRN Meds:acetaminophen **OR** acetaminophen, alum & mag hydroxide-simeth, feeding supplement (ENSURE), guaiFENesin-dextromethorphan, HYDROcodone-acetaminophen, ipratropium-albuterol, ondansetron **OR** ondansetron (ZOFRAN) IV, polyethylene glycol   Objective:  Filed Vitals:   11/12/14 0500  BP: 157/54  Pulse: 68  Temp: 97.6 F (36.4 C)  Resp: 16      Intake/Output Summary (Last 24 hours) at 11/12/14 0750 Last data filed at 11/12/14 0359  Gross per 24 hour  Intake     60 ml  Output   1050 ml  Net   -990 ml   GENERAL: confused, no distress, lethargic, frail. SKIN: skin color, texture, turgor are normal, minimal echymmoses at the venipuncture sites. No significant lesions EYES: normal, conjunctiva are pink and non-injected, sclera clear OROPHARYNX:no exudate, no erythema and lips, buccal mucosa, and tongue normal  NECK: supple, thyroid normal size, non-tender, without nodularity LYMPH:  no palpable lymphadenopathy in the cervical, axillary or inguinal LUNGS:decreased breath sounds at the bases, poor inspiratory  effort. HEART: regular rate & rhythm and no murmurs and no lower extremity edema ABDOMEN: soft, non-tender and normal bowel sounds Musculoskeletal:no cyanosis of digits and no clubbing  PSYCH/ Neuro: Confused, unable to responds to commands properly, delayed verbal responses. Falls back asleep quickly. No sensory deficits   Labs:   Recent Labs Lab 11/09/14 0541  WBC 22.8*  HGB 10.2*  HCT 32.9*  PLT 327  MCV 76.9*  MCH 23.8*  MCHC 31.0  RDW 17.0*   Chemistries:   Recent Labs Lab 11/09/14 0541  NA 140  K 3.8  CL 95*  CO2 34*  GLUCOSE 158*  BUN 18  CREATININE 0.53  CALCIUM 8.6   GFR Estimated Creatinine Clearance: 54.7 mL/min (by C-G formula based on Cr of 0.53).  Liver Function Tests: No results for input(s): AST, ALT, ALKPHOS, BILITOT, PROT, ALBUMIN in the last 168 hours. Imaging Studies: Assessment/Plan: 74 y.o. None today  Metastatic Lung Cancer to the brain Patient was admitted to hospital after found to have metastatic brain lesions She carries a recent diagnosis of Lung Cancer, per biopsy at [facility] in [facility] on 10/31/14 She is on Decadron and Keppra for seizure prophylaxis, please continue She may benefit from PET/CT outpatient to further evaluate for metastatic disease MRI brain on 3/17 showed Four brain metastases, the largest being a 5.4 cm hemorrhagic right temporal lobe metastasis with extensive surrounding edema and 10 mm of unchanged midline shift. Neurosurgery consult was obtained on 3/18 to discuss metastatic brain lesions. She is not a surgical candidate due to high risk involved. Brain simulation for whole brain  radiation cancelled due to agitation and combativeness She is not currently a candidate for Inpatient Rehab due to present status.  Still awaiting records from [facility] we need this records for further evaluation. Requested records from pulmonologist on 3/21 were unable to be obtained   At this point she may not be a  candidate for palliative chemotherapy due to overall clinical decline, even after completion of radiation therapy. 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  Appreciate OT/PT attempts to mobilize patient.   Confusion Sedating agents were decreased in attempts to improve her mental status, but patient remains lethargic.  Unable to assess improvement this morning   Full Code  Discharge planning Overall significant decline over the weekend despite weaning off some of her sedating medications. At this point she may not be a candidate for palliative chemotherapy even if she were to complete palliative radiation. Agree with Palliative Care approach Possibly discharged to inpatient hospice facility. I will return to talk to the family today  **Disclaimer: This note was dictated with voice recognition software. Similar sounding words can inadvertently be transcribed and this note may contain transcription errors which may not have been corrected upon publication of note.Sharene Butters E, PA-C 11/12/2014  7:50 AM  Meridee Branum, MD 11/12/2014

## 2014-11-12 NOTE — Progress Notes (Signed)
I met with the patient and her daughter this morning. The patient was able to complete her simulation for treatment planning earlier this week. However, we have attempted to proceed with radiation treatment on 3 occasions. She has been unable to tolerate radiation treatment due to some confusion/anxiety. We have tried using an anxiolytic, but again this was not successful.  I discussed this with them today. As has been noted, the patient has undergone a significant decline in her status in recent days. A palliative care consult has been ordered which I am told will be completed later today with the family. I believe this is very reasonable. No plans to attempt to try radiation treatment again unless her status significantly improves. If appropriate, I would be happy to revisit this issue. Otherwise, I will continue to follow the patient's course.  ------------------------------------------------  Jodelle Gross, MD, PhD

## 2014-11-12 NOTE — Progress Notes (Signed)
Progress Note from the Palliative Medicine Team at [facility]: I met today with friend [name], daughter Claiborne Billings and via telephone with sister [name]), sister Mariann Laster, mother Luellen Pucker, cousin Helene Kelp. We discussed that radiation therapy has not worked. He also discussed how quickly she has declined and that she is so weak, not eating, and basically sleeping all the time and not even talking much any more. We touched on feeding tube and I did not recommend as I do not feel this will beneficial to her overall - they agree with this recommendation. We discussed DNR and Venita Sheffield says that she was present when [name] [name] said she wanted to be resuscitated but Venita Sheffield says she feels that even then Ms. Pixler did not truly understand the consequences of this decision and the complexity of her situation. They all agree for DNR. The decision was made for comfort care and transition to hospice facility. Prognosis is poor at days to 1-2 weeks at best.     Objective: Allergies  Allergen Reactions  . Bupropion Hcl     REACTION: hives  . Codeine     REACTION: nausea but can tolerate with food   Scheduled Meds: . amoxicillin-clavulanate  1 tablet Oral BID  . dexamethasone  10 mg Intravenous 4 times per day  . feeding supplement (RESOURCE BREEZE)  1 Container Oral BID BM  . levothyroxine  25 mcg Oral QAC breakfast  . multivitamin with minerals  1 tablet Oral Daily  . pantoprazole  40 mg Oral Q1200  . sodium chloride  3 mL Intravenous Q12H  . temazepam  7.5 mg Oral QHS   Continuous Infusions:  PRN Meds:.acetaminophen **OR** acetaminophen, alum & mag hydroxide-simeth, feeding supplement (ENSURE), guaiFENesin-dextromethorphan, HYDROcodone-acetaminophen, ipratropium-albuterol, ondansetron **OR** ondansetron (ZOFRAN) IV, polyethylene glycol  BP 157/54 mmHg  Pulse 68  Temp(Src) 97.6 F (36.4 C) (Oral)  Resp 16  Ht 5' 6"  (1.676 m)  Wt 55.339 kg (122 lb)  BMI 19.70 kg/m2  SpO2 97%   PPS:  10%   Intake/Output Summary (Last 24 hours) at 11/12/14 0830 Last data filed at 11/12/14 0359  Gross per 24 hour  Intake     60 ml  Output   1050 ml  Net   -990 ml         Physical Exam:  General: NAD, sitting up in bed, thin, frail, tense HEENT: Temporal muscle wasting, no JVD Chest: Slightly labored at times, no tachypnea CVS: RRR Abdomen: Soft, NT, ND Ext: MAE, no edema, warm to touch Neuro: Very lethargic, occasionally will nod head to a question    Labs: CBC    Component Value Date/Time   WBC 22.8* 11/09/2014 0541   RBC 4.28 11/09/2014 0541   HGB 10.2* 11/09/2014 0541   HCT 32.9* 11/09/2014 0541   PLT 327 11/09/2014 0541   MCV 76.9* 11/09/2014 0541   MCH 23.8* 11/09/2014 0541   MCHC 31.0 11/09/2014 0541   RDW 17.0* 11/09/2014 0541   LYMPHSABS 0.3* 11/04/2014 2000   MONOABS 0.3 11/04/2014 2000   EOSABS 0.0 11/04/2014 2000   BASOSABS 0.0 11/04/2014 2000    BMET    Component Value Date/Time   NA 140 11/09/2014 0541   K 3.8 11/09/2014 0541   CL 95* 11/09/2014 0541   CO2 34* 11/09/2014 0541   GLUCOSE 158* 11/09/2014 0541   BUN 18 11/09/2014 0541   CREATININE 0.53 11/09/2014 0541   CALCIUM 8.6 11/09/2014 0541   GFRNONAA >90 11/09/2014 0541   GFRAA >90  11/09/2014 0541    CMP     Component Value Date/Time   NA 140 11/09/2014 0541   K 3.8 11/09/2014 0541   CL 95* 11/09/2014 0541   CO2 34* 11/09/2014 0541   GLUCOSE 158* 11/09/2014 0541   BUN 18 11/09/2014 0541   CREATININE 0.53 11/09/2014 0541   CALCIUM 8.6 11/09/2014 0541   PROT 6.1 11/04/2014 2000   ALBUMIN 2.7* 11/04/2014 2000   AST 8 11/04/2014 2000   ALT 11 11/04/2014 2000   ALKPHOS 80 11/04/2014 2000   BILITOT 0.8 11/04/2014 2000   GFRNONAA >90 11/09/2014 0541   GFRAA >90 11/09/2014 0541    Assessment and Plan: 1. Code Status: DNR -  Agreed to by all during conference. Ms. Mickelson unable to participate.  2. Symptom Control: 1. Weakness: Transitioning to comfort care.  2. Decreased  appetite: She is not eating more than a couple bites of her meals.  3. Headache: Continue decadron 10 mg QID. Roxicodone 5 mg every 2 hours prn moderate pain. Morphine 0.5 mg IV every 3 hours prn severe pain. May transition to IV pain meds if she cannot tolerate liquid pain meds.  3. Psycho/Social: Emotional support provided to patient and family at bedside and via telephone.  4. Spiritual: Chaplain is following for support.  5. Disposition: Hopeful for hospice facility.     Time In Time Out Total Time Spent with Patient Total Overall Time  0900 0950 33mn 514m    Greater than 50%  of this time was spent counseling and coordinating care related to the above assessment and plan.  AlVinie SillNP Palliative Medicine Team Pager # [phone]-F 8a-5p) Team Phone # [phone]/Weekends)

## 2014-11-12 NOTE — Progress Notes (Signed)
CSW received call from Vinie Sill, Palliative NP that plan for patient is now comfort care/residential hospice. CSW spoke with patient's daughter, Omer Jack & friend, Luvenia Heller at bedside to confirm & they expressed interest in [facility]. CSW made referral to Erling Conte, [facility] Liaison - awaiting response re: bed availability/eligibility.      Raynaldo Opitz, [facility] Clinical Social Worker cell #: [phone]

## 2014-11-13 ENCOUNTER — Ambulatory Visit: Payer: Commercial Managed Care - PPO

## 2014-11-14 ENCOUNTER — Ambulatory Visit: Payer: Commercial Managed Care - PPO

## 2014-11-14 ENCOUNTER — Encounter: Payer: Self-pay | Admitting: Radiation Oncology

## 2014-11-14 NOTE — Progress Notes (Signed)
3.25.16  Rec'd completed [employer] disability paperwork from RN/Physician.  Faxed to [phone] - scanned

## 2014-11-17 ENCOUNTER — Ambulatory Visit: Payer: Commercial Managed Care - PPO

## 2014-11-21 DEATH — deceased

## 2015-07-14 IMAGING — DX DG CHEST 1V PORT
1 series · 1 of 1 positions shown · non-contrast
Comparison: 11/04/2014

CLINICAL DATA: COPD and lung cancer.  Headache.

EXAM:
PORTABLE CHEST - 1 VIEW

[chest ap]
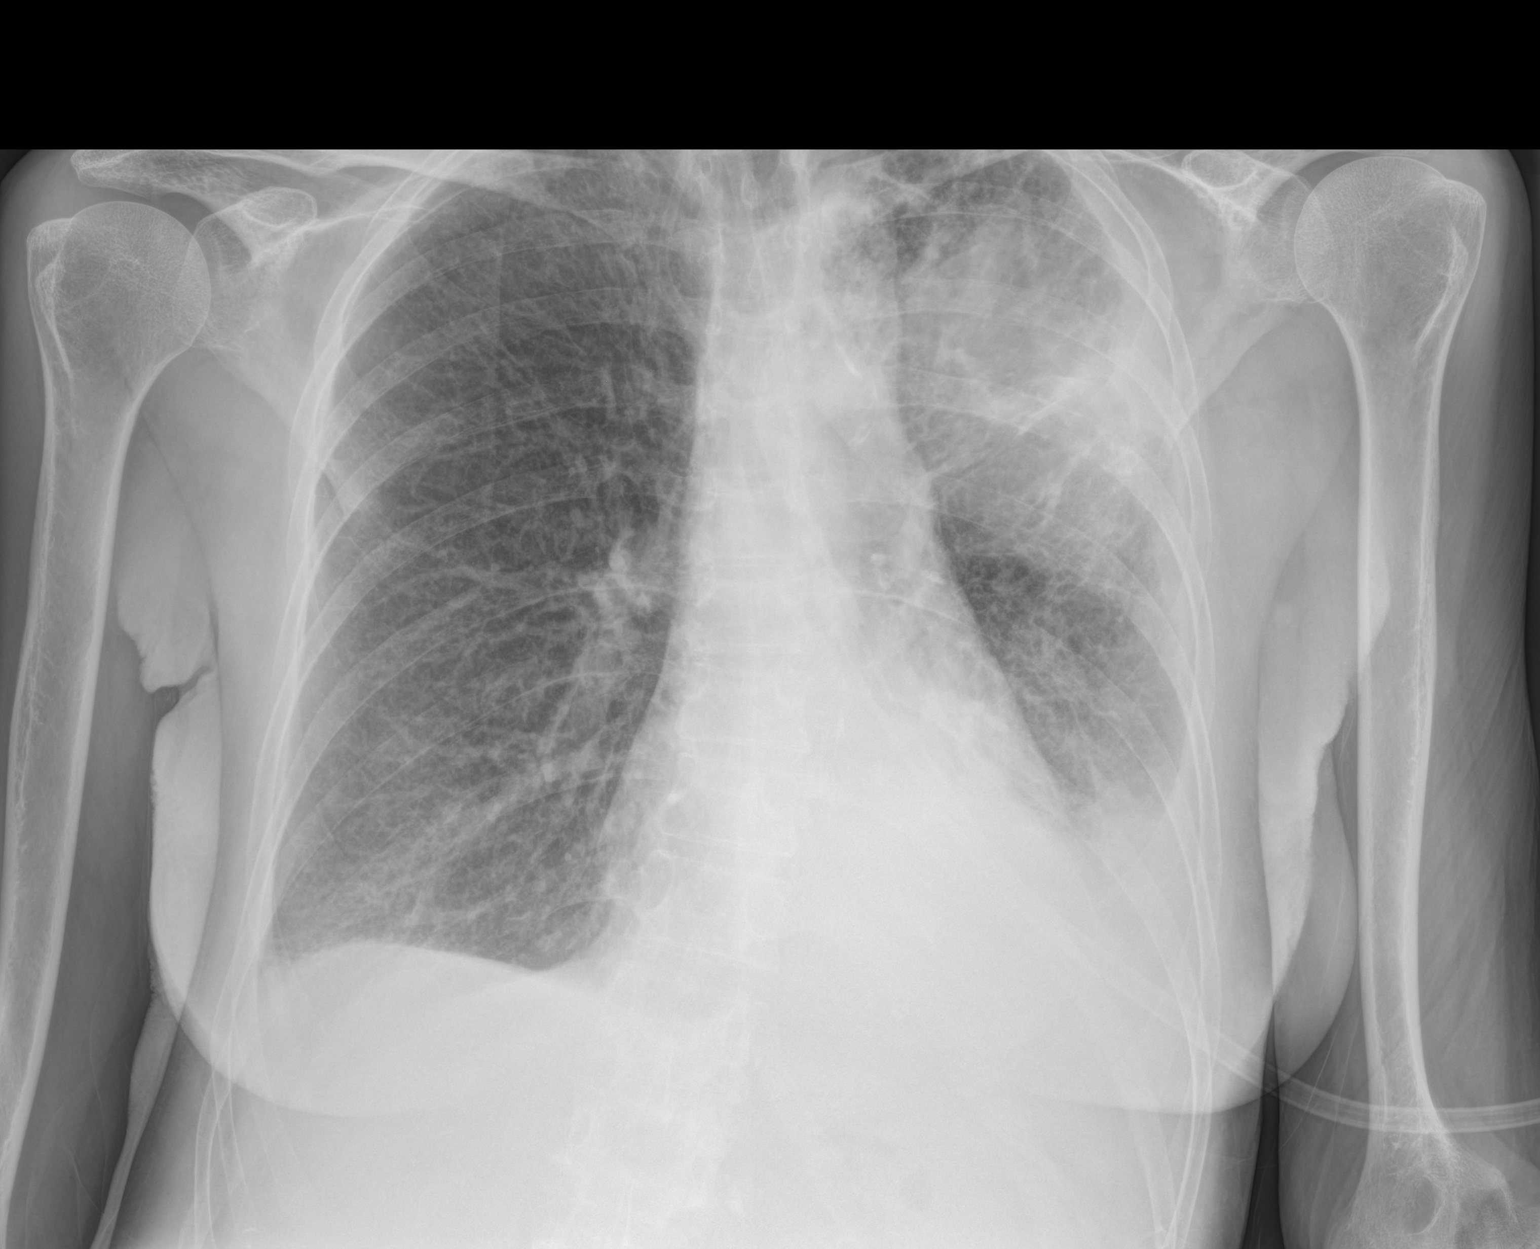

[1 of 1 positions shown; findings below may reference images not displayed]

FINDINGS: Again noted is a large density in the left upper lung compatible
with a mass. Persistent densities at the left lung base suggest
pleural fluid and volume loss. Coarse lung markings compatible with
chronic changes and emphysema. Heart and mediastinum appear to be
stable. Negative for a pneumothorax.
IMPRESSION: Stable chest radiograph findings.

Stable volume loss in the left hemithorax. Again noted is a large
lesion involving the left upper lobe and suspect left pleural fluid.
